# Patient Record
Sex: Female | Born: 1945 | Race: White | Hispanic: No | Marital: Married | Smoking: Current some day smoker
Health system: Southern US, Community
[De-identification: ages and names within clinical notes are randomized; demographics above are authoritative.]

## PROBLEM LIST (undated history)

## (undated) DIAGNOSIS — M1990 Unspecified osteoarthritis, unspecified site: Secondary | ICD-10-CM

## (undated) DIAGNOSIS — I219 Acute myocardial infarction, unspecified: Secondary | ICD-10-CM

## (undated) DIAGNOSIS — D6851 Activated protein C resistance: Secondary | ICD-10-CM

## (undated) DIAGNOSIS — I10 Essential (primary) hypertension: Secondary | ICD-10-CM

## (undated) DIAGNOSIS — J449 Chronic obstructive pulmonary disease, unspecified: Secondary | ICD-10-CM

## (undated) HISTORY — DX: Essential (primary) hypertension: I10

## (undated) HISTORY — PX: BREAST SURGERY: SHX581

## (undated) HISTORY — DX: Acute myocardial infarction, unspecified: I21.9

## (undated) HISTORY — DX: Chronic obstructive pulmonary disease, unspecified: J44.9

## (undated) HISTORY — PX: CATARACT EXTRACTION: SUR2

## (undated) HISTORY — DX: Activated protein C resistance: D68.51

## (undated) HISTORY — DX: Unspecified osteoarthritis, unspecified site: M19.90

## (undated) HISTORY — PX: ABDOMINAL HYSTERECTOMY: SHX81

## (undated) HISTORY — PX: CHOLECYSTECTOMY: SHX55

---

## 2002-09-21 ENCOUNTER — Encounter: Admission: RE | Admit: 2002-09-21 | Discharge: 2002-12-20 | Payer: Self-pay | Admitting: Anesthesiology

## 2005-11-02 ENCOUNTER — Ambulatory Visit: Payer: Self-pay | Admitting: Internal Medicine

## 2005-12-14 ENCOUNTER — Ambulatory Visit: Payer: Self-pay | Admitting: Internal Medicine

## 2007-02-06 ENCOUNTER — Ambulatory Visit: Payer: Self-pay | Admitting: Internal Medicine

## 2007-12-30 ENCOUNTER — Encounter: Payer: Self-pay | Admitting: Internal Medicine

## 2008-01-26 DIAGNOSIS — I219 Acute myocardial infarction, unspecified: Secondary | ICD-10-CM | POA: Insufficient documentation

## 2008-01-26 DIAGNOSIS — J449 Chronic obstructive pulmonary disease, unspecified: Secondary | ICD-10-CM

## 2008-01-26 DIAGNOSIS — D6859 Other primary thrombophilia: Secondary | ICD-10-CM

## 2008-01-26 DIAGNOSIS — J4489 Other specified chronic obstructive pulmonary disease: Secondary | ICD-10-CM | POA: Insufficient documentation

## 2008-01-26 DIAGNOSIS — H409 Unspecified glaucoma: Secondary | ICD-10-CM | POA: Insufficient documentation

## 2008-01-27 ENCOUNTER — Ambulatory Visit: Payer: Self-pay | Admitting: Internal Medicine

## 2008-01-27 DIAGNOSIS — K219 Gastro-esophageal reflux disease without esophagitis: Secondary | ICD-10-CM | POA: Insufficient documentation

## 2008-01-27 DIAGNOSIS — J018 Other acute sinusitis: Secondary | ICD-10-CM | POA: Insufficient documentation

## 2008-01-27 DIAGNOSIS — M79609 Pain in unspecified limb: Secondary | ICD-10-CM | POA: Insufficient documentation

## 2008-01-30 ENCOUNTER — Telehealth (INDEPENDENT_AMBULATORY_CARE_PROVIDER_SITE_OTHER): Payer: Self-pay | Admitting: *Deleted

## 2008-02-16 ENCOUNTER — Telehealth (INDEPENDENT_AMBULATORY_CARE_PROVIDER_SITE_OTHER): Payer: Self-pay | Admitting: *Deleted

## 2008-03-08 ENCOUNTER — Ambulatory Visit: Payer: Self-pay | Admitting: Internal Medicine

## 2008-03-30 ENCOUNTER — Telehealth (INDEPENDENT_AMBULATORY_CARE_PROVIDER_SITE_OTHER): Payer: Self-pay | Admitting: *Deleted

## 2008-07-16 ENCOUNTER — Ambulatory Visit: Payer: Self-pay | Admitting: Oncology

## 2008-08-10 ENCOUNTER — Encounter: Payer: Self-pay | Admitting: Internal Medicine

## 2008-08-10 LAB — CBC WITH DIFFERENTIAL/PLATELET
BASO%: 0.4 % (ref 0.0–2.0)
HCT: 42.2 % (ref 34.8–46.6)
LYMPH%: 32.2 % (ref 14.0–48.0)
MCH: 32.3 pg (ref 26.0–34.0)
MCHC: 34 g/dL (ref 32.0–36.0)
MCV: 95 fL (ref 81.0–101.0)
MONO#: 0.3 10*3/uL (ref 0.1–0.9)
MONO%: 3.8 % (ref 0.0–13.0)
NEUT%: 61.2 % (ref 39.6–76.8)
Platelets: 209 10*3/uL (ref 145–400)
RBC: 4.44 10*6/uL (ref 3.70–5.32)
WBC: 7.5 10*3/uL (ref 3.9–10.0)

## 2008-08-10 LAB — MORPHOLOGY
PLT EST: ADEQUATE
RBC Comments: NORMAL

## 2008-08-15 LAB — COMPREHENSIVE METABOLIC PANEL
ALT: 14 U/L (ref 0–35)
AST: 16 U/L (ref 0–37)
Albumin: 4.2 g/dL (ref 3.5–5.2)
BUN: 15 mg/dL (ref 6–23)
CO2: 26 mEq/L (ref 19–32)
Calcium: 8.8 mg/dL (ref 8.4–10.5)
Chloride: 104 mEq/L (ref 96–112)
Potassium: 4.7 mEq/L (ref 3.5–5.3)

## 2008-08-15 LAB — IMMUNOFIXATION ELECTROPHORESIS
IgA: 151 mg/dL (ref 68–378)
IgG (Immunoglobin G), Serum: 1280 mg/dL (ref 694–1618)
IgM, Serum: 96 mg/dL (ref 60–263)

## 2008-08-15 LAB — LACTATE DEHYDROGENASE: LDH: 194 U/L (ref 94–250)

## 2008-08-15 LAB — CRYOGLOBULIN

## 2008-08-18 LAB — UIFE/LIGHT CHAINS/TP QN, 24-HR UR
Albumin, U: DETECTED
Alpha 2, Urine: DETECTED — AB
Beta, Urine: DETECTED — AB
Free Lambda Lt Chains,Ur: 0.48 mg/dL (ref 0.08–1.01)
Free Lt Chn Excr Rate: 97.88 mg/d
Gamma Globulin, Urine: DETECTED — AB
Volume, Urine: 1250 mL

## 2008-08-18 LAB — CREATININE CLEARANCE, URINE, 24 HOUR
Collection Interval-CRCL: 24 hours
Creatinine, 24H Ur: 999 mg/d (ref 700–1800)
Creatinine, Urine: 79.9 mg/dL
Creatinine: 1.18 mg/dL (ref 0.40–1.20)

## 2008-09-01 ENCOUNTER — Ambulatory Visit: Payer: Self-pay | Admitting: Oncology

## 2009-01-19 ENCOUNTER — Encounter: Admission: RE | Admit: 2009-01-19 | Discharge: 2009-04-19 | Payer: Self-pay | Admitting: Orthopedic Surgery

## 2009-02-09 ENCOUNTER — Ambulatory Visit: Payer: Self-pay | Admitting: Oncology

## 2009-02-11 LAB — CBC WITH DIFFERENTIAL/PLATELET
BASO%: 0.4 % (ref 0.0–2.0)
Basophils Absolute: 0 10*3/uL (ref 0.0–0.1)
HCT: 42.4 % (ref 34.8–46.6)
HGB: 14.4 g/dL (ref 11.6–15.9)
LYMPH%: 36 % (ref 14.0–49.7)
MCHC: 33.9 g/dL (ref 31.5–36.0)
MONO#: 0.4 10*3/uL (ref 0.1–0.9)
NEUT%: 56.2 % (ref 38.4–76.8)
Platelets: 271 10*3/uL (ref 145–400)
WBC: 7.8 10*3/uL (ref 3.9–10.3)
lymph#: 2.8 10*3/uL (ref 0.9–3.3)

## 2009-02-17 LAB — CK: Total CK: 89 U/L (ref 7–177)

## 2009-02-17 LAB — LUPUS ANTICOAGULANT PANEL

## 2009-02-17 LAB — CARDIOLIPIN ANTIBODIES, IGG, IGM, IGA
Anticardiolipin IgA: <7 [APL'U] (ref <13)
Anticardiolipin IgG: <7 [GPL'U] (ref <11)
Anticardiolipin IgM: <7 [MPL'U] (ref <10)

## 2009-02-17 LAB — HOMOCYSTEINE: Homocysteine: 14.6 umol/L (ref 4.0–15.4)

## 2009-02-17 LAB — BETA-2 GLYCOPROTEIN ANTIBODIES
Beta-2 Glyco I IgG: <4 U/mL (ref <20)
Beta-2-Glycoprotein I IgA: 5 U/mL (ref <10)
Beta-2-Glycoprotein I IgM: <4 U/mL (ref <10)

## 2009-02-17 LAB — ALDOLASE: Aldolase: 7.6 U/L (ref <=8.1)

## 2009-02-17 LAB — B. BURGDORFI ANTIBODIES: B burgdorferi Ab IgG+IgM: 0.15 {ISR}

## 2009-07-05 ENCOUNTER — Ambulatory Visit (HOSPITAL_COMMUNITY): Admission: RE | Admit: 2009-07-05 | Discharge: 2009-07-05 | Payer: Self-pay | Admitting: Neurosurgery

## 2009-08-26 ENCOUNTER — Encounter
Admission: RE | Admit: 2009-08-26 | Discharge: 2009-08-30 | Payer: Self-pay | Admitting: Physical Medicine & Rehabilitation

## 2009-08-30 ENCOUNTER — Ambulatory Visit: Payer: Self-pay | Admitting: Physical Medicine & Rehabilitation

## 2009-09-16 ENCOUNTER — Encounter
Admission: RE | Admit: 2009-09-16 | Discharge: 2009-11-16 | Payer: Self-pay | Admitting: Physical Medicine & Rehabilitation

## 2009-09-19 ENCOUNTER — Ambulatory Visit: Payer: Self-pay | Admitting: Physical Medicine & Rehabilitation

## 2010-03-09 ENCOUNTER — Encounter
Admission: RE | Admit: 2010-03-09 | Discharge: 2010-06-07 | Payer: Self-pay | Admitting: Physical Medicine & Rehabilitation

## 2010-03-09 ENCOUNTER — Ambulatory Visit: Payer: Self-pay | Admitting: Physical Medicine & Rehabilitation

## 2010-04-10 ENCOUNTER — Ambulatory Visit: Payer: Self-pay | Admitting: Physical Medicine & Rehabilitation

## 2010-05-09 ENCOUNTER — Encounter
Admission: RE | Admit: 2010-05-09 | Discharge: 2010-07-27 | Payer: Self-pay | Admitting: Physical Medicine & Rehabilitation

## 2010-05-15 ENCOUNTER — Ambulatory Visit: Payer: Self-pay | Admitting: Physical Medicine & Rehabilitation

## 2010-07-27 ENCOUNTER — Ambulatory Visit: Payer: Self-pay | Admitting: Physical Medicine & Rehabilitation

## 2010-10-10 ENCOUNTER — Encounter
Admission: RE | Admit: 2010-10-10 | Discharge: 2010-12-19 | Payer: Self-pay | Source: Home / Self Care | Attending: Physical Medicine & Rehabilitation | Admitting: Physical Medicine & Rehabilitation

## 2010-10-19 ENCOUNTER — Ambulatory Visit: Payer: Self-pay | Admitting: Physical Medicine & Rehabilitation

## 2011-01-11 ENCOUNTER — Encounter: Payer: Medicare Other | Attending: Physical Medicine & Rehabilitation

## 2011-01-11 ENCOUNTER — Encounter: Payer: Medicare Other | Admitting: Physical Medicine & Rehabilitation

## 2011-01-11 DIAGNOSIS — M545 Low back pain, unspecified: Secondary | ICD-10-CM | POA: Insufficient documentation

## 2011-01-11 DIAGNOSIS — IMO0002 Reserved for concepts with insufficient information to code with codable children: Secondary | ICD-10-CM

## 2011-02-16 ENCOUNTER — Encounter: Payer: Medicare Other | Admitting: Physical Medicine & Rehabilitation

## 2011-02-16 ENCOUNTER — Encounter: Payer: Medicare Other | Attending: Physical Medicine & Rehabilitation

## 2011-02-16 DIAGNOSIS — M545 Low back pain, unspecified: Secondary | ICD-10-CM | POA: Insufficient documentation

## 2011-02-16 DIAGNOSIS — IMO0002 Reserved for concepts with insufficient information to code with codable children: Secondary | ICD-10-CM | POA: Insufficient documentation

## 2011-02-16 DIAGNOSIS — M76899 Other specified enthesopathies of unspecified lower limb, excluding foot: Secondary | ICD-10-CM

## 2011-03-20 ENCOUNTER — Encounter: Payer: Medicare Other | Admitting: Physical Medicine & Rehabilitation

## 2011-04-06 NOTE — Consult Note (Signed)
[name], [name]                         ACCOUNT NO.:  [id number]   MEDICAL RECORD NO.:  [id number]                   PATIENT TYPE:  REC   LOCATION:  [facility]                                  FACILITY:  [facility]   PHYSICIAN:  Zachary George, DO                      DATE OF BIRTH:  May 28, 1946   DATE OF CONSULTATION:  11/25/2002  DATE OF DISCHARGE:                                   CONSULTATION   REASON FOR CONSULTATION:  The patient returns to clinic today for  reevaluation.  She is a patient of Dr. Rosana Fret, who was last seen on  October 20, 2002, at which time she underwent radiofrequency neuroablation  of the right sacroiliac joint and right L5-S1 facet joint without any  relief.  She states that two weeks following the procedure, she noticed a  squeezing feeling in her right neck and ear and went to the emergency  department.  She states that she took a nitroglycerin, which helped her  pain, and was told by the physician that she threw a clot.  She is on  chronic Coumadin for factor V deficiency.  She states she did not have any  imaging studies to suggest clot formation but states that this happened back  in 1996 following a cervical epidural steroid injection as well, although at  that time she was not taking Coumadin.  I am uncertain of whether or not the  patient actually had some type of embolus two weeks post injection, as she  had resumed her Coumadin prior to that.  Nevertheless, the patient does not  want any more injections.  Her pain today is a 4/10 on a subjective scale.  She continues on Duragesic per [facility].  I reviewed  health and history form and 14-point review of systems.  No new neurologic  complaints.   PHYSICAL EXAMINATION:  GENERAL:  Physical exam reveals a healthy female in  no acute distress.  VITAL SIGNS:  Blood pressure 159/77, pulse 82, respirations 20, O2  saturation is 94% on room air.  NEUROMUSCULAR:  No focal neurologic  deficits noted involving cranial nerves  and peripheral nerves.  There is tenderness to palpation, right buttock and  sacroiliac joint region, without change.   IMPRESSION:  1. Chronic low back pain.  2. Sacroiliac joint pain, chronic.  3. Factor V deficiency.   PLAN:  1. The patient is to follow up with [facility].  Further     interventional procedures are not warranted at this time.  2. Recommend a trial of aquatic therapy, yoga or pilates.  3. Consider Lidoderm 5% patches.  4. Further directed care through [facility], Dr. Roseanne Reno.  Zachary George, DO    JW/MEDQ  D:  11/25/2002  T:  11/26/2002  Job:  [id number]   cc:   [address], [place], [place]  [address] [place] Newcomer.D.

## 2011-04-06 NOTE — Assessment & Plan Note (Signed)
[facility]                             PULMONARY OFFICE NOTE   [name], [name]                      MRN:          [id number]  DATE:02/06/2007                            DOB:          [birth year]    PROBLEM:  1. Chronic obstructive pulmonary disease.  2. History of myocardial infarction x2 without coronary disease.  3. Hypercoagulable state factor V Leiden (on chronic Coumadin).  4. Glaucoma.   HISTORY:  One-year followup.  She was able to stop smoking using  Chantix, but she still feels a little tentative about the issue.  Much  encouragement was given to remain off.  Is dealing with fibromyalgia.  Bitemporal headache for the past 2 weeks with nasal congestion.  No  fever, purulent discharge, or sore throat.   MEDICATIONS:  1. Kadian 40 mg b.i.d.  2. Xanax 0.5 mg b.i.d.  3. Benicar 30 mg.  4. Premarin 0.625 mg.  5. Nexium 40 mg.  6. Coumadin.  7. Cardizem 180 mg.  8. Relafen 500 mg.  9. Aspirin 81 mg.  10.Calcium.  11.Combivent rescue inhaler used p.r.n.  12.Lyrica.   DRUG INTOLERANT TO:  1. PENICILLIN.  2. SULFA.   OBJECTIVE:  Weight 169 pounds.  BP 138/88.  Pulse regular at 74.  Room  air saturation 97%.  I do not see obvious nasal congestion, postnasal drainage, pharyngeal  irritation, or cervical adenopathy.  She is not especially hoarse.  I  cannot tell if temporal arteries are tender.  LUNG SOUNDS:  Diminished without cough or wheeze.  HEART:  Sounds regular without murmur.   IMPRESSION:  This may be a seasonal rhinitis with headache aggravated by  a tension headache component.  Temporal arteritis can be watched for.  She will be following up with her primary physician.  I am delighted  that she has stopped smoking, but she does have chronic obstructive  pulmonary disease demonstrated by pulmonary function test on December 14, 2005 showing moderate obstructive airways disease with insignificant  response to  bronchodilator.  FEV1 after dilator was 1.41 or 62% of  predicted.  Lung volumes were normal.  Diffusion was moderately reduced  at 60% of predicted suggesting this will be mostly emphysema.  Her chest  x-ray in December 2006 had shown apical scarring with comparison  recommended.   PLAN:  1. Chest x-ray.  2. Nasal inhalation with Neo-Synephrine.  3. Depo-Medrol 80 mg IM with steroid talk.  4. Try Sudafed-PE.  5. See Dr. Virginia Rochester for persistent bitemporal headache if needed.  6. Schedule return here in 1 year, but earlier p.r.n.     Clinton D. Maple Hudson, MD, Tonny Bollman, FACP  Electronically Signed    CDY/MedQ  DD: 02/08/2007  DT: 02/08/2007  Job #: [id number]   cc:   Royanne Foots

## 2011-04-06 NOTE — Op Note (Signed)
[name], [name]                         ACCOUNT NO.:  [id number]   MEDICAL RECORD NO.:  [id number]                   PATIENT TYPE:  REC   LOCATION:  [facility]                                  FACILITY:  [facility]   PHYSICIAN:  Celene Kras, MD                     DATE OF BIRTH:  31-Dec-1945   DATE OF PROCEDURE:  10/20/2002  DATE OF DISCHARGE:                                 OPERATIVE REPORT   BRIEF HISTORY:  The patient comes in for pain management today.  I have  evaluated her via health and history form 14-point review of systems.   1. The patient comes in remarking that she had excellent diminution of pain     perception and a very positive response to sacral joint injection with 5-     0 facetal companion injection as contributory innervation.  She has put     up a Christmas tree, dropped her pain level, and is only now starting to     recrudesce.  She is noticing some decline in functional indices and     quality of life indices after the injection, and only recently noting her     pain closely approximating a return to pre block position.  I reviewed     the risks, complications and options of moving forward with RF.  The     rationale is that RF will minimize further exposure to steroids, and the     necessity for her to come off of Coumadin for factor V deficiency.  She     also understands she is not to take NSAIDs, although they did help.     Another rationale is to minimize escalation of narcotic based pain     medication as one of our only viable pain treatment strategies.  I do     think she had a facet component as well as sacral joint component, we     have demonstrated a positive provocative block with improved functional     indices and quality of life indices and decreased medication usage.  2. I will go ahead and proceed with RF today, she understands that the risks     of this procedure include bleeding, infection, nerve damage, potential     escalation of pain, no  relief, nerve damage, stroke and death.  I do     believe that the risk-reward benefit is in her favor, and she understands     that we are looking at a 25 to 50% diminution in pain perception but more     importantly, improved functional indices.   She is also instructed to go back on her Coumadin.  We checked her PT today.  It is 11.8.  INR is normal.  Instructed to maintain contact with primary  care.  We will predicate any further injection on need and overall response  and consider  recommending further extending thermal lesion to the facet and  contralateral sacral joint if necessary.  She has had pulsatile radio  frequency in the past and I think a thermal lesion will probable give [place] a  little more reach.  She has consented.   PHYSICAL EXAMINATION:  Physical exam reveals a pleasant female sitting  comfortably in bed.  Gait, affect, and appearance is normal.  She has pain  over PSIS and Gaeslens and Patrick's are positive right greater than left.  Pain with extension and no new neurological findings. Motor and sensory are  reflexive.   IMPRESSION:  1. Sacral joint pain, facet syndrome.  2. Degenerative spine disease lumbar spine.  3. Osteoarthritis and otherwise as noted.  4. Factor V deficient.   PLAN:  Sacral joint injection, right side with radiofrequency thermal  ablation in a stairstep fashion, and contributory innervation will be  addressed by radiofrequency neuroablation to the 5-1 facet.  She has  consented.   DESCRIPTION OF PROCEDURE:  The patient was taken to the fluoroscopy suite  and placed in the prone position.  Back prepped and draped in the usual  fashion.  Using a 22 gauge 10 mm active tip RF needle, we advanced in a  stair step fashion to three active lesion points at the right sacral joint.  I confirmed placement with multiple fluoroscopic positions.  I used Isovue  200, appropriately stimulated both motor and sensory.  I then followed 3 cc  of lidocaine  and 20 mg of Aristocort and reconfirmed placement at multiple  points during this procedure.  We then lesioned at 70 degree for 70 seconds at three independent points,  and then moved to the 5-1 facet.  Again reconfirmed needle placement, and  used Isovue 200, and clear identification of the appropriate landmarks  followed.  Stimulation to motor and sensory was uneventful and we injected 1  cc of lidocaine 1% MPF and 10 mg of Aristocort. Lesion was performed at 70  degrees for seconds. In the recovery area she had near complete diminution  in pain perception on assessment within the context of activities of daily  living.  No complications from our procedure.  Discharge instructions given.  We will see her in follow-up at about one to one and a half months.  Instructed to maintain contact with [facility].                                               Celene Kras, MD    HH/MEDQ  D:  10/20/2002  T:  10/20/2002  Job:  [id number]

## 2011-04-06 NOTE — Op Note (Signed)
[name], [name]                         ACCOUNT NO.:  [id number]   MEDICAL RECORD NO.:  [id number]                   PATIENT TYPE:  REC   LOCATION:  [facility]                                  FACILITY:  [facility]   PHYSICIAN:  Hans C. Stevphen Rochester, M.D.                DATE OF BIRTH:  [birth year]   DATE OF PROCEDURE:  DATE OF DISCHARGE:                                 OPERATIVE REPORT   HISTORY OF PRESENT ILLNESS:  [name] comes to center for pain  management today as a kind referral from [facility]. She is a  pleasant 65 year old who comes to [place] complaining of paracervical and  paralumbar pain. The paralumbar pain is most problematic, right greater than  left, and she does have a left-sided component. She has infragluteal pain,  and she relates this back a number of years from a fall in  1985. She has  had facetal component addressed which helped from a myofacial perspective,  but she still has decline in endurance, range of motion and functional  indices, relating her pain as a 7/10 on a subjective scale with cervical  myofacial position is less problematic than the infragluteal component, most  notably to the right. She has declined sleep and endurance. Suprascapular,  paracervical, paralumbar, myofacial pain is described. She has no bowel or  bladder dysfunction. She describes it as dull, constant aching and sharp,  improved with ice and medication, made worse by walking, bending, working  and therapy. [facility] from the [facility] as well as imaging  reports are reviewed. She has no recent injury.   MEDICATIONS:  1. Premarin.  2. Zanaflex.  3. Desipramine.  4. Coumadin.  5. Lescol.  6. Prilosec.  7. Xanax.  8. Duragesic.   ALLERGIES:  SULFA AND PENICILLIN.   States no wish to harm herself or others.   A 14 point review of systems and health and history form were reviewed. Most  notably two MIs in 1998 and 1996 led to the diagnosis of factor V  deficiency  and hypercoagulable.  She has come off Coumadin in the past uneventfully and  instructed to take aspirin. We will clear her to come for procedure.   PAST SURGICAL HISTORY:  Otherwise denied.   Her cardiac events were uneventful from a recovery standpoint.   FAMILY HISTORY:  Remarkable for hypertension and heart disease.   SOCIAL HISTORY:  She is a previous smoker, quit three months ago.  She does  not drink alcohol. She is married. She is currently not working.   Review of systems, family and social history otherwise noncontributory to  the main problem.   PHYSICAL EXAMINATION:  GENERAL: This is a pleasant female, sitting  comfortably in bed. Gait, affect appears in normal range x 3.  HEENT:  Unremarkable.  CHEST:  Clear to auscultation and percussion.  CARDIAC:  Regular rate and rhythm without murmurs, rubs, gallops.  ABDOMEN:  Soft, nontender, benign, no hepatosplenomegaly.  BACK:  She has  diffuse paracervical and suprascapular and myofascial discomfort, paralumbar  myofascial discomfort, most  notably over  the PSIS and pain on extension.  Gaenslen's and Patrick's equivocal.  She does seem to have a positive  Patrick's on the right, but she has difficulty performing this maneuver  secondary to pain. I do believe that the sacral joint component is involved.  She has no other overt neurological deficit, motor, sensory or reflex.   IMPRESSION:  1. Factor V deficiency.  2. Degenerative spinal disease, lumbar spine.  3. Degenerative spinal disease, cervical spine.  4. Myofacial pain syndrome.  5. Poor overall health characteristics.   PLAN:  1. Lifestyle enhancements discussed as well as home based therapy.  2. Consider TENS technology or muscle stimulator.  3. Clear to come off Coumadin and continue aspirin therapy and consider SI     joint injection versus radiofrequency neuroablation to best determine     pain generator.  4. Consider treatment in the myofascial  amplification, most notably in the     cervical spine, as I believe there is a facetal component.  5. Maintain contact with [facility].  6. We will see her in followup in directed care. Her care will be predicated     on ability to discontinue anticoagulation and reviewed with the patient.     The risks of this is discussed. She accepts.   We will see her in followup, extensive consultation.                                               Hans C. Stevphen Rochester, M.D.    [facility]  D:  09/22/2002  T:  09/22/2002  Job:  [id number]   cc:   [facility]

## 2011-04-06 NOTE — Op Note (Signed)
NAMEALIZZON, DIOGUARDI                         ACCOUNT NO.:  [id number]   MEDICAL RECORD NO.:  [id number]                   PATIENT TYPE:  REC   LOCATION:  [facility]                                  FACILITY:  [facility]   PHYSICIAN:  Hans C. Stevphen Rochester, M.D.                DATE OF BIRTH:  05-26-1946   DATE OF PROCEDURE:  10/06/2002  DATE OF DISCHARGE:                                 OPERATIVE REPORT   INDICATIONS FOR PROCEDURE:  [name] comes to [facility] today to evaluate and review health and history form and 14-point  review of systems.   Problem 1.  Her PT, PTT, INR are within normal limits.  She was instructed  to go back on her Coumadin.   Problem 2.  I do think it is important that we proceed with sacral joint  injection and prior to ORIF clearly identify pain generator.  She had kind  of a mixed picture in the past with good response from lumbar epidural and  questionable response from sacral joint injection and I will clarify.   Problem 3.  I will add the 5-1 facet medial branch, as this is contributory.  She does describe pain consistent with this.  Indications are declining  functional indices, quality of life indices, and restorative sleep cavity.  Examination does support sacral joint injection, but as she also has  significant extension pain, I am wondering if this will not need to be  investigated at some time as well.   Objectively, diffuse paralumbar myofascial discomfort, not significantly  changed.  _________ is intact, was positive right and left.  Pain over PSIS  with notable pain on extension, no new neurological findings, motor,  sensory, or reflexive.   IMPRESSION:  Degenerative spine disease of the lumbar spine, sacral joint  pain, facet syndrome.   PLAN:  Facet medial branch block L5-S1 as contributory intervention. We will  also inject sacral joint right and left side.  This will be at inferior  margin.  She has consented.  We will call  her in follow-up and will not have  her come off her Coumadin until we can reassess our direction.  A strong  positive provocative block with consideration of radiofrequency neural  ablation.  She has consented.   DESCRIPTION OF PROCEDURE:  The patient was taken to the operating room and  placed in the prone position.  The back was prepped and draped in the usual  fashion.  Using a 22 gauge needle, I advanced the medial branch 5-1 facet  right and left sided, independent needle access points, confirmed placement  in multiple fluoroscopic positions, and injected 1 cc of lidocaine 1% MPF  and 5 mg of Aristocort at each site.   Care was then addressed to the inferior medial margin of the sacral joint  right and left side and confirmed placed in multiple fluoroscopic positions.  I then injected 3 cc of lidocaine 1% MPF to the left and right side with 15  mg of Aristocort to each side as well.   Tolerated the procedure well with no complications from our procedure.  Appropriate recovery.  Discharge instructions given.  Will see her in follow-  up.  Further injection based on need and overall response.                                               Hans C. Stevphen Rochester, M.D.    [facility]  D:  10/06/2002  T:  10/06/2002  Job:  [id number]   cc:   [facility]

## 2011-04-10 ENCOUNTER — Encounter: Payer: Medicare Other | Attending: Physical Medicine & Rehabilitation

## 2011-04-10 ENCOUNTER — Encounter: Payer: Medicare Other | Admitting: Physical Medicine & Rehabilitation

## 2011-04-10 DIAGNOSIS — M545 Low back pain, unspecified: Secondary | ICD-10-CM | POA: Insufficient documentation

## 2011-04-10 DIAGNOSIS — R209 Unspecified disturbances of skin sensation: Secondary | ICD-10-CM

## 2011-04-10 DIAGNOSIS — IMO0002 Reserved for concepts with insufficient information to code with codable children: Secondary | ICD-10-CM | POA: Insufficient documentation

## 2011-04-10 DIAGNOSIS — R279 Unspecified lack of coordination: Secondary | ICD-10-CM

## 2011-04-11 NOTE — Assessment & Plan Note (Signed)
REASON FOR VISIT:  Poor balance and falls.  A 65 year old female who has been followed by PM and R here for about a year and half, primarily for lumbar epidural steroid injections.  She does have a history of weakness and paresthesias and has been worked up by Neuromuscular Disease Department at [facility] with EMG.  She has had MRIs as well as lumbar CT myelograms which last performed 2010 showing no significant compressive lesions, some spondylolisthesis.  She has had facial paresthesias feeling like something is on her skin but it is not there.  She has had upper extremity and lower extremity paresthesias. She does have some neck pain.  She has had some swallowing problems at times as well, although she is also being seen by Gastroenterology.  She has been evaluated by Neurosurgery in the past.  In terms of her low back neck pain, she has had some relief with medial branch blocks and sacroiliac injections done in 2003 per Dr. Andrey Campanile and Dr. Stevphen Rochester when they were at this clinic.  Her last epidural steroid injection was performed October 19, 2010.  She does get approximately 3 months relief.  Her right trochanteric bursa injection was not particularly helpful performed February 16, 2011.  PHYSICAL EXAMINATION:  She has a bruise on the left hip, small hematoma. She has some bruises on her forearms.  Face shows no evidence of facial droop.  Extraocular muscles are intact. Tongue is midline.  Speech is without dysarthria or aphasia.  Her upper extremity strength is 5/5 deltoid, biceps, triceps, grip.  Lower extremity strength is 5/5 hip flexion, knee extension, ankle dorsiflexor.  Deep tendon reflexes are 3+ bilateral upper and bilateral knees, 2+ bilateral ankles.  Sensory exam was diminished bilateral ulnar nerve distribution.  Extremities does have a lipoma in the medial forearm on the left side.  Minimal intrinsic atrophy on left side compared to the right side. Motor strength is 4/5 in  the left grip, biceps, triceps; otherwise 5/5 throughout upper and lower extremities bilaterally.  Romberg, she tends to fall forward with her eyes closed.  Babinski's are downgoing bilaterally.  Hoffman's is negative.  IMPRESSION: 1. Falls and paresthesias.  Previous workup included lower extremity     EMG as well as lumbar CT myelogram, which were non-revealing.     Given that she has both facial and upper extremity symptomatology     as well as some mildly hyporeactive reflexes, we will check MRI of     the brain to see if there is any cerebral lesions.  This would have     to be a chronic process given a 4-year history. 2. Upper extremity sensory deficit in ulnar distribution.  We will     check EMG/NCV.  She also has some numbness in the hand, apparent     when she is on the telephone suggesting more of a median     neuropathy, once again upper extremity EMG would be helpful. 3. Chronic low back pain.  She does have some facet arthropathy noted,     has had some previous good relief with     medial branch block.  She may be a candidate for RF, but we will do     this once we get the workup for her other symptomatology resolved.     Discussed with the patient, agrees with plan.     Erick Colace, M.D. Electronically Signed    AEK/MedQ D:  04/10/2011 15:41:29  T:  04/11/2011 04:52:05  Job #:  [id number]  cc:   Royanne Foots Fax: [phone]

## 2011-04-18 ENCOUNTER — Other Ambulatory Visit: Payer: Self-pay | Admitting: Physical Medicine & Rehabilitation

## 2011-04-18 DIAGNOSIS — R52 Pain, unspecified: Secondary | ICD-10-CM

## 2011-04-23 ENCOUNTER — Inpatient Hospital Stay (HOSPITAL_COMMUNITY): Admission: RE | Admit: 2011-04-23 | Payer: Medicare Other | Source: Ambulatory Visit

## 2011-04-30 ENCOUNTER — Ambulatory Visit (HOSPITAL_COMMUNITY)
Admission: RE | Admit: 2011-04-30 | Discharge: 2011-04-30 | Disposition: A | Discharge status: Home / Self Care | Payer: Medicare Other | Source: Ambulatory Visit | Attending: Physical Medicine & Rehabilitation | Admitting: Physical Medicine & Rehabilitation

## 2011-04-30 DIAGNOSIS — R279 Unspecified lack of coordination: Secondary | ICD-10-CM | POA: Insufficient documentation

## 2011-04-30 DIAGNOSIS — R269 Unspecified abnormalities of gait and mobility: Secondary | ICD-10-CM | POA: Insufficient documentation

## 2011-04-30 DIAGNOSIS — R209 Unspecified disturbances of skin sensation: Secondary | ICD-10-CM | POA: Insufficient documentation

## 2011-04-30 DIAGNOSIS — R29898 Other symptoms and signs involving the musculoskeletal system: Secondary | ICD-10-CM | POA: Insufficient documentation

## 2011-04-30 DIAGNOSIS — R52 Pain, unspecified: Secondary | ICD-10-CM

## 2011-05-04 ENCOUNTER — Ambulatory Visit: Payer: Medicare Other | Admitting: Physical Medicine & Rehabilitation

## 2011-05-25 ENCOUNTER — Ambulatory Visit: Payer: Medicare Other | Admitting: Physical Medicine & Rehabilitation

## 2011-05-25 ENCOUNTER — Encounter: Payer: Medicare Other | Attending: Physical Medicine & Rehabilitation

## 2011-05-25 DIAGNOSIS — M545 Low back pain, unspecified: Secondary | ICD-10-CM | POA: Insufficient documentation

## 2011-05-25 DIAGNOSIS — IMO0002 Reserved for concepts with insufficient information to code with codable children: Secondary | ICD-10-CM | POA: Insufficient documentation

## 2011-05-25 NOTE — Assessment & Plan Note (Signed)
CHIEF COMPLAINT:  Back pain.  HISTORY:  A 65 year old female followed primarily for lumbar stenosis and radicular pain in the lower extremities, has had epidural steroid injections which have been helpful for about 59-month period of time. More recently she has complained of balance problems and falls, but these improved without any treatment.  We did do an MRI of the brain which showed no evidence of any acute changes, no demyelinating changes, no evidence of CVAs.  She has also had weakness, paresthesias in the limbs, had a EMG through the Neuromuscular Disease Department at [facility].  I do not have these results.  In addition, she has left elbow weakness, paresthesias in the left hand. I was suspicious for ulnar neuropathy.  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  She has had GI complaints.  She has a history of colitis.  She has had problems with swelling in the legs, has had Cardiology workup once again with a normal ejection fraction on echocardiogram, normal stress test.  Her pain level is about 5/10, mainly in the low back area, some buttock pain.  Described as constant, moderate effect on activity, walking, bending, sitting, standing, all seem to exacerbate her, pain relief from meds is fair.  She does get her hydrocodone from her primary care physician.  She has bowel and bladder control problems, weakness, tingling, trouble walking, anxiety, constipation on her review of systems.  SOCIAL HISTORY:  Married, lives with her husband, smokes a pack per day still despite her medical problems.  PHYSICAL EXAMINATION:  VITAL SIGNS:  Blood pressure 101/59, pulse 74, respirations 16, and O2 sat 94% on room air. GENERAL:  No acute stress.  Mood and affect appropriate.  Elderly female in no acute distress. BACK:  Tenderness to palpation in lumbosacral junction.  She has negative straight leg raising test.  She has no evidence of wasting,  she has 2+ edema bilateral pretibial to the foot.  No problems with ambulation.  IMPRESSION:  Lumbar stenosis has intermittent radicular symptomatology. In addition, she has left upper extremity pain.  I suspect left upper extremity more of ulnar neuropathy, she may have some mass effect from lipoma.  She did give me MRI of the elbow to evaluate.  Certainly this would not give [place] nerve functioning information.  I discussed this with the patient.  Tried to looking at the MRI on my desk computer and unfortunately this could not be read.  We discussed her back pain at greater length given that she likely has some spondylosis as at least potential etiology of her pain, medial branch blocks will be helpful.  I also reviewed the report from [facility], there is no compressive lesion over her ulnar nerve or around the elbow area. Collateral ligaments were intact.  Biceps tendon was intact.  Normal appearance of the cubital tunnel.  Should symptoms worsen or not improve may benefit from ulnar nerve EMG, plus/minus ultrasound.     Erick Colace, M.D. Electronically Signed    AEK/MedQ D:  05/25/2011 13:47:27  T:  05/25/2011 17:17:08  Job #:  [id number]  cc:   Dr. [facility]

## 2011-07-05 ENCOUNTER — Ambulatory Visit (HOSPITAL_BASED_OUTPATIENT_CLINIC_OR_DEPARTMENT_OTHER)
Admission: RE | Admit: 2011-07-05 | Discharge: 2011-07-05 | Disposition: A | Discharge status: Home / Self Care | Payer: Medicare Other | Source: Ambulatory Visit | Attending: Physical Medicine & Rehabilitation | Admitting: Physical Medicine & Rehabilitation

## 2011-07-05 ENCOUNTER — Encounter: Payer: Medicare Other | Admitting: Physical Medicine & Rehabilitation

## 2011-07-05 ENCOUNTER — Encounter: Payer: Medicare Other | Attending: Physical Medicine & Rehabilitation

## 2011-07-05 DIAGNOSIS — M47817 Spondylosis without myelopathy or radiculopathy, lumbosacral region: Secondary | ICD-10-CM

## 2011-07-05 DIAGNOSIS — M545 Low back pain, unspecified: Secondary | ICD-10-CM | POA: Insufficient documentation

## 2011-07-05 DIAGNOSIS — IMO0002 Reserved for concepts with insufficient information to code with codable children: Secondary | ICD-10-CM | POA: Insufficient documentation

## 2011-07-05 NOTE — Procedures (Signed)
NAMEMITRA, DULING             ACCOUNT NO.:  [id number]  MEDICAL RECORD NO.:  [id number]           PATIENT TYPE:  O  LOCATION:  [facility]                          FACILITY:  [facility]  PHYSICIAN:  Erick Colace, M.D.DATE OF BIRTH:  30-Jun-1946  DATE OF PROCEDURE:  07/05/2011 DATE OF DISCHARGE:  05/25/2011                              OPERATIVE REPORT  PROCEDURE:  Bilateral L5 dorsal ramus injection, bilateral L4 medial branch block, bilateral L3 medial branch block under fluoroscopic guidance.  INDICATIONS:  Lumbar spondylosis with axial back pain.  No radicular component.  Informed consent was obtained after describing risks and benefits of the procedure with the patient.  These include bleeding, bruising and infection.  She elects to proceed and has given written consent.  Time- out taken to confirm proper levels.  Informed consent was obtained after describing risks and benefits of the procedure with the patient.  She elects to proceed and has given written consent.  She has pain that persists despite narcotic analgesic medications and other conservative care, interferes with activities and is rated as severe.  The patient placed prone on fluoroscopy table.  Betadine prep, sterile drape, 25-gauge inch and half needle was used to anesthetize the skin and subcu tissue 1% lidocaine x2 mL.  Then, a 25-gauge inch and half needle was used to anesthetize the skin and subcu tissue 1% lidocaine x 1 mL at each of 6 sites and a 22-gauge, 3-1/2-inch spinal needle was inserted under fluoroscopic guidance, first starting at the left S1, SAP sacroiliac junction, bone contact made.  Omnipaque 180 x 0.5 mL demonstrated no intravascular uptake, then 0.5 mL of a solution containing 1 mL of 4 mg/mL dexamethasone and 2 mL of 2% MPF lidocaine was injected.  Then, the left L5, SAP transverse process junction targeted, bone contact made.  Omnipaque 180 x 0.5 mL demonstrated no intravascular uptake.   Then, 0.5 mL of the dexamethasone lidocaine solution was injected.  Then, a left L4, SAP transverse process junction targeted, bone contact made.  Omnipaque 180 x 0.5 mL demonstrated no intravascular uptake, then 0.5 mL of the dexamethasone lidocaine solution was injected.  The same procedure was repeated on the right side using the same needle injectate and technique.  The patient tolerated the procedure well.  Pre and post injection vitals stable. Post injection instructions given.     Erick Colace, M.D. Electronically Signed    AEK/MEDQ  D:  07/05/2011 12:37:35  T:  07/05/2011 13:07:46  Job:  [id number]

## 2011-07-27 ENCOUNTER — Ambulatory Visit: Payer: Medicare Other | Admitting: Physical Medicine & Rehabilitation

## 2011-07-27 ENCOUNTER — Encounter: Payer: Medicare Other | Attending: Physical Medicine & Rehabilitation

## 2011-07-27 DIAGNOSIS — M545 Low back pain, unspecified: Secondary | ICD-10-CM | POA: Insufficient documentation

## 2011-07-27 DIAGNOSIS — M79609 Pain in unspecified limb: Secondary | ICD-10-CM

## 2011-07-27 DIAGNOSIS — M48061 Spinal stenosis, lumbar region without neurogenic claudication: Secondary | ICD-10-CM

## 2011-07-27 DIAGNOSIS — M47817 Spondylosis without myelopathy or radiculopathy, lumbosacral region: Secondary | ICD-10-CM

## 2011-07-27 DIAGNOSIS — IMO0002 Reserved for concepts with insufficient information to code with codable children: Secondary | ICD-10-CM | POA: Insufficient documentation

## 2011-07-27 NOTE — Assessment & Plan Note (Signed)
HISTORY:  The patient returns today and last saw me a month ago for medial branch blocks bilateral L5, L4, L3.  She had 100% relief with pain for 6 days and pain has returned.  She has had lower extremity pain and has had workup.  She is going back to a vein specialist to see whether this would be helpful to get treatment for her varicose veins. She has a family history of lower extremity pain.  Her mother had some type of hypersensitivity.  She had varicose veins as well.  She has had some relief with epidural injections fairly short-term, but certainly could be one of her pain generators in the lower extremity. She reportedly had EMG and NCV for about 3 years ago which was reportedly normal.  She is on Coumadin.  She has gotten the okay to discontinue Coumadin 5-7 days prior to spine procedure.  Her back has tenderness to palpation in the lumbosacral junction, pain with extension greater than with flexion.  Straight leg raising test negative.  Sensory exam, she has some decreased sensation in the left toes compared to left knee, but equal on the right side.  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  She has some ecchymosis around her toes.  IMPRESSION: 1. Deep tendon reflexes are normal in the lower extremities. 2. Lumbar pain.  Lumbar spinal stenosis while I think some of her pain     may be radicular, certainly has other pain etiologies.  I favor re-     examining the potential role of her venous varicosities as a     potential pain generator.  We also discussed repeating EMG and NCV     which she really does not want to pursue at this time. 3. Low back pain.  She has had 100% relief with medial branch blocks.     She wishes to pursue another set to evaluate whether she would be a     candidate for radiofrequency neurotomy.  Discussed with the patient     and agrees with plan.     Erick Colace, M.D. Electronically Signed    AEK/MedQ D:  07/27/2011 13:10:25  T:   07/27/2011 17:11:12  Job #:  [id number]  cc:   Royanne Foots, MD Fax: [phone]

## 2011-08-16 ENCOUNTER — Ambulatory Visit: Payer: Medicare Other | Admitting: Physical Medicine & Rehabilitation

## 2011-08-29 DIAGNOSIS — M109 Gout, unspecified: Secondary | ICD-10-CM | POA: Insufficient documentation

## 2011-09-03 ENCOUNTER — Encounter: Payer: Medicare Other | Attending: Physical Medicine & Rehabilitation

## 2011-09-03 ENCOUNTER — Encounter: Payer: Medicare Other | Admitting: Physical Medicine & Rehabilitation

## 2011-09-03 DIAGNOSIS — M47817 Spondylosis without myelopathy or radiculopathy, lumbosacral region: Secondary | ICD-10-CM

## 2011-09-03 DIAGNOSIS — M545 Low back pain, unspecified: Secondary | ICD-10-CM | POA: Insufficient documentation

## 2011-09-03 DIAGNOSIS — IMO0002 Reserved for concepts with insufficient information to code with codable children: Secondary | ICD-10-CM | POA: Insufficient documentation

## 2011-09-03 NOTE — Procedures (Signed)
NAMEEVERETTE, DIMAURO             ACCOUNT NO.:  [id number]  MEDICAL RECORD NO.:  [id number]           PATIENT TYPE:  O  LOCATION:  [facility]                          FACILITY:  [facility]  PHYSICIAN:  Erick Colace, M.D.DATE OF BIRTH:  18-Dec-1945  DATE OF PROCEDURE: DATE OF DISCHARGE:                              OPERATIVE REPORT  PROCEDURE:  Bilateral L5 dorsal ramus injection, bilateral L4 and L3 medial branch block under fluoroscopic guidance.  INDICATIONS:  Lumbar spondylosis.  She has had greater than 50% relief with medial branch block performed initially July 05, 2011 here for repeat.  Her pain is only partially responsive to medication management and other conservative care interferes with activities.  Informed consent was obtained.  After describing risks and benefits of the procedure with the patient, these include bleeding, bruising and infection, she elects to proceed and has given written consent, taken for proper patient procedure.  Average pain score 8/10.  The patient placed prone on fluoroscopy table.  Betadine prep, sterile drape, 25-gauge inch and half needle was used to anesthetize the skin and subcu tissue 1.5 mL at each of 6 sites.  Then, a 22-gauge, 3-1/2 spinal needle was inserted under fluoroscopic guidance first starting at the left S1 SAP sacroiliac junction, bone contact made.  Omnipaque 180 x 0.5 mL demonstrated no intravascular uptake, then 0.5 mL of 0.5% Marcaine was injected.  Then, the left L5 SAP transverse process junction targeted, bone contact made.  Omnipaque 180 x 0.5 mL demonstrated no intravascular uptake.  Then, 0.5 mL of Marcaine 0.5% injected.  Then the left L4 SAP transverse process junction targeted, bone contact made.  Omnipaque 180 x 0.5 mL demonstrated no intravascular uptake, then 0.5 mL of the Marcaine solution was injected.  The same procedure was repeated on the right side using same needle injectate and technique.  The patient  tolerated the procedure well.  Pre and post injection vitals stable.  Post injection instructions given.     Erick Colace, M.D. Electronically Signed    AEK/MEDQ  D:  09/03/2011 12:48:13  T:  09/03/2011 21:13:25  Job:  [id number]

## 2011-10-04 ENCOUNTER — Encounter: Payer: Medicare Other | Admitting: Physical Medicine & Rehabilitation

## 2011-10-04 ENCOUNTER — Encounter: Payer: Medicare Other | Attending: Physical Medicine & Rehabilitation

## 2011-10-04 DIAGNOSIS — M47817 Spondylosis without myelopathy or radiculopathy, lumbosacral region: Secondary | ICD-10-CM

## 2011-10-04 DIAGNOSIS — M545 Low back pain, unspecified: Secondary | ICD-10-CM | POA: Insufficient documentation

## 2011-10-04 DIAGNOSIS — IMO0002 Reserved for concepts with insufficient information to code with codable children: Secondary | ICD-10-CM | POA: Insufficient documentation

## 2011-10-04 NOTE — Procedures (Signed)
[name], [name]             ACCOUNT NO.:  [id number]  MEDICAL RECORD NO.:  [id number]           PATIENT TYPE:  O  LOCATION:  [facility]                          FACILITY:  [facility]  PHYSICIAN:  Erick Colace, M.D.DATE OF BIRTH:  [birth year]  DATE OF PROCEDURE:  10/04/2011 DATE OF DISCHARGE:                              OPERATIVE REPORT  PROCEDURE:  Right L5 dorsal ramus, right L4 and right L3 medial branch radiofrequency neurotomy under fluoroscopic guidance.  INDICATION:  Lumbar pain with pain only partially response to medication management, other conservative care.  Interfering with activities and self-care.  Pain is rated as 4/10 currently, increase it 5/10 with certain activities.  Pain has been improved by 100% on 2 occasions by medial branch blocks at corresponding levels.  She has been off her Coumadin,  okayed to do so by her cardiologist Dr. Virginia Rochester.  Informed consent was obtained after describing risks and benefits of the procedure with the patient.  These include bleeding, bruising, and infection.  She elects to proceed and has given written consent.  The patient placed prone on fluoroscopy table.  Betadine prep, sterile drape, 25-gauge inch and half needle was used to anesthetize skin and subcu tissue, 1% lidocaine x2 mL at each of 3 sites.  Then, a 20-gauge 10 cm RF needle with 10-mm curved active tip was inserted under fluoroscopic guidance starting the right S1 SAP sacral ala junction, bone contact made, confirmed with lateral imaging.  Sensory stem at 50 Hz followed by motor stem at 2 Hz, confirmed proper needle location, followed by injection 1 mL of solution containing 1 mL of 4 mg/mL dexamethasone and 2 mL of 1% MPF lidocaine.  Then, the right L5 SAP transverse process junction targeted, bone contact made, confirmed with lateral imaging.  Sensory stem at 50 Hz followed by motor stem at 2 Hz, confirmed proper needle location, followed by injection 1 mL of  the dexamethasone-lidocaine solution and radiofrequency lesioning 70 degrees Celsius for 90 seconds.  Then, the right L4 SAP transverse process junction targeted, bone contact made, confirmed with lateral imaging. Sensory stem at 50 Hz followed by motor stem at 2 Hz, confirmed proper needle location followed by injection 1 mL of the dexamethasone- lidocaine solution and radiofrequency lesioning 70 degrees Celsius for 90 seconds.  The patient tolerated procedure well.  Postprocedure instructions given.     Erick Colace, M.D. Electronically Signed    AEK/MEDQ  D:  10/04/2011 13:13:52  T:  10/04/2011 14:21:55  Job:  [id number]

## 2011-11-02 ENCOUNTER — Ambulatory Visit: Payer: Medicare Other | Admitting: Physical Medicine & Rehabilitation

## 2011-11-30 ENCOUNTER — Ambulatory Visit: Payer: Medicare Other | Admitting: Physical Medicine & Rehabilitation

## 2011-11-30 ENCOUNTER — Encounter: Payer: Medicare Other | Attending: Physical Medicine & Rehabilitation

## 2011-11-30 DIAGNOSIS — M48061 Spinal stenosis, lumbar region without neurogenic claudication: Secondary | ICD-10-CM

## 2011-11-30 DIAGNOSIS — IMO0002 Reserved for concepts with insufficient information to code with codable children: Secondary | ICD-10-CM | POA: Insufficient documentation

## 2011-11-30 DIAGNOSIS — M545 Low back pain, unspecified: Secondary | ICD-10-CM | POA: Insufficient documentation

## 2011-11-30 DIAGNOSIS — G894 Chronic pain syndrome: Secondary | ICD-10-CM

## 2011-12-02 NOTE — Assessment & Plan Note (Signed)
This is a patient of Dr. Wynn Banker', on his scheduled date due to time constraints, I saw her for him.  In mid November, he did right L5 dorsal ramus and right L3-4 medial branch radiofrequency neurotomy.  She states it helped about half of her pain go away.  She wants the left side done. She rates her average pain at a 4.  It is intermittent, sharp pain. General activity level is 3-4.  Pain is worse in the morning and evening.  Sleep patterns are fair.  Pain is worse with walking, bending, and standing.  Rest, heat, medication, and injections help.  She walks without assistance.  She can climb a few steps.  Functionally, she needs help with household duties.  REVIEW OF SYSTEMS:  Notable for difficulties as described above as well as some bladder control issues, weakness, spasms, and anxiety.  No suicidal thoughts or aberrant behaviors.  She did not bring her hydrocodone in for pill count today, but she states she has got plenty of it and does not need a refill.  PAST MEDICAL HISTORY/SOCIAL HISTORY/FAMILY HISTORY:  Unchanged.  PHYSICAL EXAMINATION:  VITAL SIGNS:  Blood pressure 121/58, pulse 78, respirations 16, and O2 sats 94 on room air. NEUROLOGIC:  Motor strength and sensation are intact. CONSTITUTIONAL:  She is within normal limits.  She is alert and oriented x3.  ASSESSMENT:  Lumbar stenosis and low back pain.  PLAN:  Continue her current medication regimen and she will follow up with Dr. Wynn Banker in a month for the injection on the left side and a medial branch block radiofrequency neurotomy.  Otherwise, her questions were encouraged and answered.     Demesha Donaldson L. Blima Dessert Electronically Signed    RLW/MedQ D:  11/30/2011 15:16:23  T:  12/02/2011 12:23:33  Job #:  [id number]

## 2011-12-25 DIAGNOSIS — Z79899 Other long term (current) drug therapy: Secondary | ICD-10-CM | POA: Insufficient documentation

## 2011-12-25 DIAGNOSIS — IMO0002 Reserved for concepts with insufficient information to code with codable children: Secondary | ICD-10-CM | POA: Insufficient documentation

## 2011-12-25 DIAGNOSIS — R609 Edema, unspecified: Secondary | ICD-10-CM | POA: Insufficient documentation

## 2011-12-27 ENCOUNTER — Encounter: Payer: Medicare Other | Attending: Physical Medicine & Rehabilitation

## 2011-12-27 ENCOUNTER — Ambulatory Visit: Payer: Medicare Other | Admitting: Physical Medicine & Rehabilitation

## 2011-12-27 DIAGNOSIS — IMO0002 Reserved for concepts with insufficient information to code with codable children: Secondary | ICD-10-CM | POA: Insufficient documentation

## 2011-12-27 DIAGNOSIS — M47817 Spondylosis without myelopathy or radiculopathy, lumbosacral region: Secondary | ICD-10-CM

## 2011-12-27 DIAGNOSIS — M545 Low back pain, unspecified: Secondary | ICD-10-CM | POA: Insufficient documentation

## 2011-12-27 NOTE — Procedures (Signed)
[name], [name]             ACCOUNT NO.:  [id number]  MEDICAL RECORD NO.:  [id number]           PATIENT TYPE:  O  LOCATION:  [facility]                          FACILITY:  [facility]  PHYSICIAN:  Erick Colace, M.D.DATE OF BIRTH:  1945-12-04  DATE OF PROCEDURE: DATE OF DISCHARGE:                              OPERATIVE REPORT  PROCEDURE:  This is a left L5 dorsal ramus, left L4 and left L3 medial branch radiofrequency neurotomy under fluoroscopic guidance.  INDICATION:  Lumbar spondylosis without myelopathy.  She has had good results with medial branch blocks at corresponding levels.  She has had a right-sided radiofrequency performed on October 04, 2011, here for the left side.  Informed consent was obtained after describing risks and benefits of the procedure with the patient.  These include bleeding, bruising, and infection.  She elects to proceed and has given written consent.  The patient placed prone on fluoroscopy table.  Betadine prep, sterile drape, 25-gauge inch and half needle was used to anesthetize skin, subcu tissue, 1% lidocaine x2 mL.  Then, a 20-gauge 10 cm RF needle with 10-mm curved active tip was inserted under fluoroscopic guidance, starting the right.  The left S1 SAP sacral ala junction, bone contact made, confirmed with lateral imaging.  Sensory stim at 50 Hz followed by motor stim at 2 Hz, confirmed proper needle location, followed by injection of 1 mL of dexamethasone lidocaine solution and radiofrequency lesioning 70 degrees Celsius for 70 seconds.  Then, the left L5 SAP transverse process junction targeted, bone contact made, confirmed with lateral imaging.  Sensory stim at 50 Hz followed by motor stim at 2 Hz, confirmed proper location followed by injection 1 mL of the dexamethasone lidocaine solution and radiofrequency lesioning 70 degrees Celsius for 90 seconds.  Then, the left L4 SAP transverse process junction targeted, bone contact made, confirmed  with lateral imaging. Sensory stim at 50 Hz followed by motor stim at 2 Hz, confirmed proper needle location, followed by injection of 1 mL of the dexamethasone lidocaine solution and radiofrequency lesioning 70 degrees Celsius for 90 seconds.  The patient tolerated procedure well.  Postprocedure instructions given.  She is to take 1800 mg of Neurontin tonight when she gets back.     Erick Colace, M.D. Electronically Signed    AEK/MEDQ  D:  12/27/2011 13:13:35  T:  12/27/2011 20:46:38  Job:  [id number]

## 2012-01-28 ENCOUNTER — Telehealth: Payer: Self-pay | Admitting: Physical Medicine & Rehabilitation

## 2012-01-28 MED ORDER — HYDROCODONE-ACETAMINOPHEN 5-325 MG PO TABS: 1.0000 | ORAL_TABLET | Freq: Four times a day (QID) | ORAL | Status: DC

## 2012-01-28 NOTE — Telephone Encounter (Signed)
Needs refill on Vicodin

## 2012-01-28 NOTE — Telephone Encounter (Signed)
Rx sent in, pt aware.  

## 2012-03-11 ENCOUNTER — Telehealth: Payer: Self-pay

## 2012-03-11 MED ORDER — HYDROCODONE-ACETAMINOPHEN 5-325 MG PO TABS: 1.0000 | ORAL_TABLET | Freq: Four times a day (QID) | ORAL | Status: DC

## 2012-03-11 NOTE — Telephone Encounter (Signed)
Pt requesting refill on hydrocodone.  [phone]

## 2012-03-11 NOTE — Telephone Encounter (Signed)
Rx called in, pt aware 

## 2012-03-25 ENCOUNTER — Encounter: Payer: Self-pay | Admitting: Physical Medicine & Rehabilitation

## 2012-03-25 ENCOUNTER — Encounter: Payer: Medicare Other | Attending: Physical Medicine & Rehabilitation

## 2012-03-25 ENCOUNTER — Ambulatory Visit (HOSPITAL_BASED_OUTPATIENT_CLINIC_OR_DEPARTMENT_OTHER): Payer: Medicare Other | Admitting: Physical Medicine & Rehabilitation

## 2012-03-25 VITALS — BP 136/39 | HR 80 | Resp 16 | Ht 63.5 in | Wt 164.0 lb

## 2012-03-25 DIAGNOSIS — M545 Low back pain, unspecified: Secondary | ICD-10-CM | POA: Insufficient documentation

## 2012-03-25 DIAGNOSIS — IMO0002 Reserved for concepts with insufficient information to code with codable children: Secondary | ICD-10-CM | POA: Insufficient documentation

## 2012-03-25 DIAGNOSIS — M47817 Spondylosis without myelopathy or radiculopathy, lumbosacral region: Secondary | ICD-10-CM

## 2012-03-25 DIAGNOSIS — M47816 Spondylosis without myelopathy or radiculopathy, lumbar region: Secondary | ICD-10-CM

## 2012-03-25 MED ORDER — HYDROCODONE-ACETAMINOPHEN 5-325 MG PO TABS: 1.0000 | ORAL_TABLET | Freq: Three times a day (TID) | ORAL | Status: DC | PRN

## 2012-03-25 NOTE — Progress Notes (Signed)
  Subjective:    Patient ID: [name], female    DOB: Jul 12, 1946, 66 y.o.   MRN: [id number]  HPI Off methotrexate and prednisone do to cataract surgery. 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 Patient complains of weakness in the legs. We discussed exercise program options. Overall aquatic exercise is unlikely to cause any problems with her arthritis and in fact should be beneficial as well as fields muscle strength and endurance. Pain Inventory Average Pain 4 Pain Right Now 6 My pain is constant, sharp and aching  In the last 24 hours, has pain interfered with the following? General activity 6 Relation with others 3 Enjoyment of life 4 What TIME of day is your pain at its worst? all the time Sleep (in general) Fair  Pain is worse with: walking, bending and standing Pain improves with: heat/ice, medication and injections Relief from Meds: 5  Mobility walk without assistance how many minutes can you walk? 30 ability to climb steps?  yes do you drive?  yes Do you have any goals in this area?  yes  Function retired  Neuro/Psych bladder control problems tingling trouble walking spasms anxiety  Prior Studies Any changes since last visit?  no  Physicians involved in your care Any changes since last visit?  no       Review of Systems  Constitutional: Negative.   HENT: Negative.   Eyes: Negative.   Respiratory: Positive for shortness of breath.   Cardiovascular: Negative.   Gastrointestinal: Positive for abdominal pain.  Genitourinary: Negative.   Musculoskeletal: Positive for joint swelling.  Skin: Negative.   Neurological: Negative.   Hematological: Negative.   Psychiatric/Behavioral: Negative.        Objective:   Physical Exam  Constitutional: She is oriented to person, place, and time. She appears well-developed and well-nourished.  Musculoskeletal:       Lumbar back: She exhibits decreased range of motion.       Pain with  lumbar extension greater than with flexion.  Neurological: She is alert and oriented to person, place, and time. No sensory deficit.       4/5 in UE and LE s  Psychiatric: She has a normal mood and affect.          Assessment & Plan:  1. Lumbar spondylosis without myelopathy. She's done well with radiofrequency procedures. The right side was last performed in November of 2012. It has worn off. We'll schedule for repeat. The left side is still doing well and was performed in February. I also recommend aquatic exercise. Followup with rheumatologist for her remitting sero negative for arthritis.

## 2012-03-25 NOTE — Patient Instructions (Signed)
Recommend [employer] arthritis aquatic program  Radiofrequency procedure right side next visit

## 2012-04-29 ENCOUNTER — Ambulatory Visit: Payer: Medicare Other | Admitting: Physical Medicine & Rehabilitation

## 2012-05-06 ENCOUNTER — Encounter: Payer: Self-pay | Admitting: Physical Medicine & Rehabilitation

## 2012-05-06 ENCOUNTER — Other Ambulatory Visit: Payer: Self-pay | Admitting: *Deleted

## 2012-05-06 ENCOUNTER — Encounter: Payer: Medicare Other | Attending: Physical Medicine & Rehabilitation

## 2012-05-06 ENCOUNTER — Ambulatory Visit (HOSPITAL_BASED_OUTPATIENT_CLINIC_OR_DEPARTMENT_OTHER): Payer: Medicare Other | Admitting: Physical Medicine & Rehabilitation

## 2012-05-06 VITALS — BP 158/62 | HR 87 | Resp 16 | Ht 63.5 in | Wt 164.0 lb

## 2012-05-06 DIAGNOSIS — M47817 Spondylosis without myelopathy or radiculopathy, lumbosacral region: Secondary | ICD-10-CM

## 2012-05-06 DIAGNOSIS — IMO0002 Reserved for concepts with insufficient information to code with codable children: Secondary | ICD-10-CM | POA: Insufficient documentation

## 2012-05-06 DIAGNOSIS — M545 Low back pain, unspecified: Secondary | ICD-10-CM | POA: Insufficient documentation

## 2012-05-06 DIAGNOSIS — M47816 Spondylosis without myelopathy or radiculopathy, lumbar region: Secondary | ICD-10-CM

## 2012-05-06 NOTE — Progress Notes (Signed)
  PROCEDURE RECORD [facility]   Name: [name] DOB:02/23/1946 MRN: [id number]  Date:05/06/2012  Physician: Claudette Laws, MD    Nurse/CMA: Noralyn Pick, CMA  Allergies:  Allergies  Allergen Reactions  . Penicillins   . Sulfonamide Derivatives     Consent Signed: yes  Is patient diabetic? no   Pregnant: no LMP: No LMP recorded. Patient has had a hysterectomy. (age [age])  Anticoagulants: no Anti-inflammatory: no Antibiotics: no  Procedure: Lumbar Radiofrequency  Position: Prone Start Time: 1:47pm  End Time: 2:16pm  Fluoro Time: 37 seconds  RN/CMA Hampton Abbot    Time 1:31pm 2:21pm    BP 158/62 165/62    Pulse 87 82    Respirations 16 16    O2 Sat 88% 85%    S/S 6 6    Pain Level 6/10 6/10     D/C home with [name] (daughter), patient A & O X 3, D/C instructions reviewed, and sits independently.

## 2012-05-06 NOTE — Progress Notes (Signed)
PROCEDURE: Right L5 dorsal ramus, right L4 and right L3 medial branch  radiofrequency neurotomy under fluoroscopic guidance.  INDICATION: Lumbar pain with pain only partially response to medication  management, other conservative care. Interfering with activities and  self-care. Pain is rated as 4/10 currently, increase it 3/08 with  certain activities. Pain has been improved by 100% on 2 occasions by  medial branch blocks at corresponding levels. She has been off her  Coumadin, okayed to do so by her cardiologist Dr. Virginia Rochester. INR 1.0 Informed consent was obtained after describing risks and benefits of the  procedure with the patient. These include bleeding, bruising, and  infection. She elects to proceed and has given written consent. The  patient placed prone on fluoroscopy table. Betadine prep, sterile  drape, 25-gauge inch and half needle was used to anesthetize skin and  subcu tissue, 1% lidocaine x2 mL at each of 3 sites. Then, a 20-gauge  10 cm RF needle with 10-mm curved active tip was inserted under  fluoroscopic guidance starting the right S1 SAP sacral ala junction,  bone contact made, confirmed with lateral imaging. Sensory stem at 50  Hz followed by motor stem at 2 Hz, confirmed proper needle location,  followed by injection 1 mL of solution containing 1 mL of 4 mg/mL  dexamethasone and 2 mL of 1% MPF lidocaine. Then, the right L5 SAP  transverse process junction targeted, bone contact made, confirmed with  lateral imaging. Sensory stem at 50 Hz followed by motor stem at 2 Hz,  confirmed proper needle location, followed by injection 1 mL of the  dexamethasone-lidocaine solution and radiofrequency lesioning 80 degrees  Celsius for 90 seconds. Then, the right L4 SAP transverse process  junction targeted, bone contact made, confirmed with lateral imaging.  Sensory stem at 50 Hz followed by motor stem at 2 Hz, confirmed proper  needle location followed by injection 1 mL of the  dexamethasone-  lidocaine solution and radiofrequency lesioning 80 degrees Celsius for  90 seconds. The patient tolerated procedure well. Postprocedure  instructions given.

## 2012-05-06 NOTE — Patient Instructions (Addendum)
Radiofrequency Lesioning Care After Refer to this sheet in the next few weeks. These instructions provide you with information on caring for yourself after your procedure. Your caregiver may also give you more specific instructions. Your treatment has been planned according to current medical practices, but problems sometimes occur. Call your caregiver if you have any problems or questions after your procedure. HOME CARE INSTRUCTIONS  Take pain medicine as directed by your caregiver.   You may have pain from the burned nerve for a while after the procedure. This takes time to heal. Ask your caregiver how long you should expect pain.   You should be able to return to your normal activities a couple of days after the procedure. When you can go back to work will depend on the type of work you do. Discuss this with your caregiver.   Pay close attention to how you feel after the procedure. If you start having pain, write down when it hurts and how it feels. This will help you and your caregiver know if you need another treatment.  SEEK MEDICAL CARE IF:  The site where the needle was inserted for the procedure becomes red, swollen, or tender to touch.   Your pain does not get better.  SEEK IMMEDIATE MEDICAL CARE IF:  You develop sudden, severe pain.   You develop numbness or tingling near the procedure site.   You have a fever.  MAKE SURE YOU:  Understand these instructions.   Will watch your condition.   Will get help right away if you are not doing well or get worse.  Document Released: 07/04/2011 Document Revised: 10/25/2011 Document Reviewed: 07/04/2011 [employer] Patient Information 2012 [employer]. 

## 2012-05-20 ENCOUNTER — Telehealth: Payer: Self-pay | Admitting: Physical Medicine & Rehabilitation

## 2012-05-20 MED ORDER — HYDROCODONE-ACETAMINOPHEN 5-325 MG PO TABS: 1.0000 | ORAL_TABLET | Freq: Three times a day (TID) | ORAL | Status: DC | PRN

## 2012-05-20 NOTE — Telephone Encounter (Signed)
Refill on Vicodin

## 2012-05-20 NOTE — Telephone Encounter (Signed)
Rx has been called in, pt aware. 

## 2012-06-09 ENCOUNTER — Encounter: Payer: Medicare Other | Attending: Physical Medicine & Rehabilitation

## 2012-06-09 ENCOUNTER — Encounter: Payer: Self-pay | Admitting: Physical Medicine & Rehabilitation

## 2012-06-09 ENCOUNTER — Ambulatory Visit (HOSPITAL_BASED_OUTPATIENT_CLINIC_OR_DEPARTMENT_OTHER): Payer: Medicare Other | Admitting: Physical Medicine & Rehabilitation

## 2012-06-09 VITALS — BP 143/54 | HR 87 | Resp 16 | Ht 63.5 in | Wt 160.4 lb

## 2012-06-09 DIAGNOSIS — M48061 Spinal stenosis, lumbar region without neurogenic claudication: Secondary | ICD-10-CM

## 2012-06-09 DIAGNOSIS — M545 Low back pain, unspecified: Secondary | ICD-10-CM | POA: Insufficient documentation

## 2012-06-09 DIAGNOSIS — IMO0002 Reserved for concepts with insufficient information to code with codable children: Secondary | ICD-10-CM | POA: Insufficient documentation

## 2012-06-09 NOTE — Progress Notes (Signed)
Subjective:    Patient ID: [name], female    DOB: 1946/11/10, 66 y.o.   MRN: [id number]  HPI  Last visit underwent lumbar medial branch radiofrequency neurotomy without significant relief Off methotrexate since cataract surgery.  Patient complains of weakness in the legs. We discussed exercise program options. Overall aquatic exercise is unlikely to cause any problems with her arthritis and in fact should be beneficial as well as fields muscle strength and endurance.   No other new issues Pain Inventory Average Pain 5 Pain Right Now 6 My pain is constant, dull, tingling and aching  In the last 24 hours, has pain interfered with the following? General activity 4 Relation with others 3 Enjoyment of life 4 What TIME of day is your pain at its worst? morning and evening Sleep (in general) Fair  Pain is worse with: walking, bending and standing Pain improves with: rest, heat/ice and medication Relief from Meds: 6  Mobility walk without assistance ability to climb steps?  yes do you drive?  yes  Function retired I need assistance with the following:  household duties and shopping  Neuro/Psych bladder control problems tingling spasms anxiety  Prior Studies Any changes since last visit?  no  Physicians involved in your care Any changes since last visit?  no   Family History  Problem Relation Age of Onset  . Heart disease Father   . Factor V Leiden deficiency Father   . Factor V Leiden deficiency Brother   . Factor V Leiden deficiency Daughter    History   Social History  . Marital Status: Married    Spouse Name: N/A    Number of Children: N/A  . Years of Education: N/A   Social History Main Topics  . Smoking status: Current Everyday Smoker  . Smokeless tobacco: Never Used  . Alcohol Use: No  . Drug Use: None  . Sexually Active: None   Other Topics Concern  . None   Social History Narrative  . None   Past Surgical History  Procedure Date    . Cataract extraction   . Cholecystectomy   . Abdominal hysterectomy   . Breast surgery    Past Medical History  Diagnosis Date  . Hypertension   . Arthritis   . Cataract   . COPD (chronic obstructive pulmonary disease)   . Factor 5 Leiden mutation, heterozygous   . Myocardial infarction    BP 143/54  Pulse 87  Resp 16  Ht 5' 3.5" (1.613 m)  Wt 160 lb 6.4 oz (72.757 kg)  BMI 27.97 kg/m2  SpO2 91%   Review of Systems  Respiratory: Positive for shortness of breath and wheezing.   Musculoskeletal: Positive for back pain.  Hematological: Bruises/bleeds easily.  All other systems reviewed and are negative.       Objective:   Physical Exam  Constitutional: She is oriented to person, place, and time. She appears well-developed and well-nourished.  Musculoskeletal:       Lumbar back: She exhibits decreased range of motion. She exhibits no tenderness, no edema, no deformity and no spasm.  Neurological: She is alert and oriented to person, place, and time. She has normal strength and normal reflexes. She displays no atrophy. No sensory deficit. Coordination and gait normal.  Psychiatric: She has a normal mood and affect.          Assessment & Plan:  1. Lumbar pain. Has evidence of spondylosis and good relief short-term with medial branch blocks however does not  have any long-term relief with the radiofrequency neurotomy. I would not recommend repeat Would continue current medication management Recommend therapy in swimming pool to increase mobility. Followup with primary M.D. For questions related to starting nortriptyline.

## 2012-06-09 NOTE — Patient Instructions (Signed)
You'll see my PA Clydie Braun in 3 months no change recommended care medications for pain. I do not think a repeat radiofrequency will be helpful given that the last one was not

## 2012-06-20 ENCOUNTER — Telehealth: Payer: Self-pay | Admitting: *Deleted

## 2012-06-20 MED ORDER — HYDROCODONE-ACETAMINOPHEN 5-325 MG PO TABS: 1.0000 | ORAL_TABLET | Freq: Three times a day (TID) | ORAL | Status: DC | PRN

## 2012-06-20 NOTE — Telephone Encounter (Signed)
Hydrocodone refill.   This has been called in for her.

## 2012-07-22 ENCOUNTER — Telehealth: Payer: Self-pay | Admitting: *Deleted

## 2012-07-22 MED ORDER — HYDROCODONE-ACETAMINOPHEN 5-325 MG PO TABS: 1.0000 | ORAL_TABLET | Freq: Three times a day (TID) | ORAL | Status: DC | PRN

## 2012-07-22 NOTE — Telephone Encounter (Signed)
Rx has been called in, pt aware. 

## 2012-07-22 NOTE — Telephone Encounter (Signed)
Hydrocodone refill. 

## 2012-08-19 ENCOUNTER — Telehealth: Payer: Self-pay | Admitting: Physical Medicine & Rehabilitation

## 2012-08-19 MED ORDER — HYDROCODONE-ACETAMINOPHEN 5-325 MG PO TABS: 1.0000 | ORAL_TABLET | Freq: Three times a day (TID) | ORAL | Status: DC | PRN

## 2012-08-19 NOTE — Telephone Encounter (Signed)
Refill on Vicodin

## 2012-08-19 NOTE — Telephone Encounter (Signed)
Rx has been called in, pt aware. 

## 2012-09-08 ENCOUNTER — Encounter
Payer: Medicare Other | Attending: Physical Medicine and Rehabilitation | Admitting: Physical Medicine and Rehabilitation

## 2012-09-08 ENCOUNTER — Encounter: Payer: Self-pay | Admitting: Physical Medicine and Rehabilitation

## 2012-09-08 VITALS — BP 153/53 | HR 88 | Resp 16 | Ht 63.0 in | Wt 162.0 lb

## 2012-09-08 DIAGNOSIS — Z5181 Encounter for therapeutic drug level monitoring: Secondary | ICD-10-CM

## 2012-09-08 DIAGNOSIS — M79609 Pain in unspecified limb: Secondary | ICD-10-CM | POA: Insufficient documentation

## 2012-09-08 DIAGNOSIS — M47816 Spondylosis without myelopathy or radiculopathy, lumbar region: Secondary | ICD-10-CM

## 2012-09-08 DIAGNOSIS — M549 Dorsalgia, unspecified: Secondary | ICD-10-CM

## 2012-09-08 DIAGNOSIS — M47817 Spondylosis without myelopathy or radiculopathy, lumbosacral region: Secondary | ICD-10-CM

## 2012-09-08 DIAGNOSIS — M542 Cervicalgia: Secondary | ICD-10-CM

## 2012-09-08 DIAGNOSIS — M545 Low back pain, unspecified: Secondary | ICD-10-CM | POA: Insufficient documentation

## 2012-09-08 NOTE — Patient Instructions (Signed)
Try to look into exercise groups in the water.

## 2012-09-08 NOTE — Progress Notes (Signed)
Subjective:    Patient ID: [name], female    DOB: 28-Nov-1945, 66 y.o.   MRN: [id number]  HPI The patient is a 66 year old female , who presents with LBP, which radiates into both of her LE . The symptoms started in 1985 after a fall . The patient complains about moderate pain .  Patient denies any numbness and tingling in her LE  .   Applying heat, or ice taking medications , changing positions alleviate the symptoms. Prolonged sitting and standing aggrevates the symptoms. The patient grades her pain as a 7 /10. The patient also complains about neck pain, which has gotten worse in the last 6 month.She denies any radiation into her UE.  Pain Inventory Average Pain 7 Pain Right Now 7 My pain is intermittent, sharp, tingling and aching  In the last 24 hours, has pain interfered with the following? General activity 6 Relation with others 4 Enjoyment of life 6 What TIME of day is your pain at its worst? varies Sleep (in general) Fair  Pain is worse with: walking, bending and standing Pain improves with: heat/ice, medication and injections Relief from Meds: 7  Mobility walk without assistance how many minutes can you walk? 5 ability to climb steps?  yes do you drive?  yes Do you have any goals in this area?  yes  Function retired I need assistance with the following:  shopping  Neuro/Psych bladder control problems tingling spasms anxiety  Prior Studies Any changes since last visit?  no  Physicians involved in your care Any changes since last visit?  no   Family History  Problem Relation Age of Onset  . Heart disease Father   . Factor V Leiden deficiency Father   . Factor V Leiden deficiency Brother   . Factor V Leiden deficiency Daughter    History   Social History  . Marital Status: Married    Spouse Name: N/A    Number of Children: N/A  . Years of Education: N/A   Social History Main Topics  . Smoking status: Current Every Day Smoker  . Smokeless  tobacco: Never Used  . Alcohol Use: No  . Drug Use: None  . Sexually Active: None   Other Topics Concern  . None   Social History Narrative  . None   Past Surgical History  Procedure Date  . Cataract extraction   . Cholecystectomy   . Abdominal hysterectomy   . Breast surgery    Past Medical History  Diagnosis Date  . Hypertension   . Arthritis   . Cataract   . COPD (chronic obstructive pulmonary disease)   . Factor 5 Leiden mutation, heterozygous   . Myocardial infarction    BP 153/53  Pulse 88  Resp 16  Ht 5\' 3"  (1.6 m)  Wt 162 lb (73.483 kg)  BMI 28.70 kg/m2  SpO2 67%     Review of Systems  HENT: Positive for neck pain.   Musculoskeletal: Positive for myalgias, back pain and arthralgias.  Psychiatric/Behavioral: The patient is nervous/anxious.   All other systems reviewed and are negative.       Objective:   Physical Exam  Constitutional: She is oriented to person, place, and time. She appears well-developed and well-nourished.  HENT:  Head: Normocephalic.  Neck: Neck supple.  Musculoskeletal: She exhibits tenderness.  Neurological: She is alert and oriented to person, place, and time.  Skin: Skin is warm and dry.  Psychiatric: She has a normal mood and affect.  Symmetric normal motor tone is noted throughout. Normal muscle bulk. Muscle testing reveals 5/5 muscle strength of the upper extremity, and 5/5 of the lower extremity, except right iliopsoas 4-/5. Full range of motion in upper and lower extremities. ROM of spine is restricted. Fine motor movements are normal in both hands. Sensory is intact and symmetric to light touch, pinprick and proprioception. DTR in the upper and lower extremity are present and symmetric 2+. No clonus is noted.  Patient arises from chair without difficulty. Narrow based gait with normal arm swing bilateral , able to walk on heels and toes . Tandem walk is possible , slightly unstable. No pronator drift. Rhomberg  negative.        Assessment & Plan:  1. Lumbar spondylosis,   Lumbar pain radiating into her LE bilateral, intermittendly. Has evidence of spondylosis and good relief short-term with medial branch blocks however does not have any long-term relief with the radiofrequency neurotomy. I would not recommend repeat  Would continue current medication management  Recommend therapy in swimming pool to increase mobility, patient does not really want to do this.  2. Neck pain, ordered X-rays, consider PT with modalities, depending on results. Educated patient about good posture. Follow up in 3 month, might order PT in between.

## 2012-09-12 ENCOUNTER — Telehealth: Payer: Self-pay

## 2012-09-12 NOTE — Telephone Encounter (Signed)
Message copied by Judd Gaudier on Fri Sep 12, 2012 12:53 PM ------      Message from: Su Monks      Created: Fri Sep 12, 2012  9:16 AM       UDS inconsistent, please ask patient whether she takes any migraine or headache medication, like fioricet, butalbital showed up in her UDS

## 2012-09-12 NOTE — Telephone Encounter (Signed)
Patient does take Fioricet and has taken it recently.

## 2012-09-18 ENCOUNTER — Telehealth: Payer: Self-pay | Admitting: Physical Medicine & Rehabilitation

## 2012-09-18 MED ORDER — HYDROCODONE-ACETAMINOPHEN 5-325 MG PO TABS: 1.0000 | ORAL_TABLET | Freq: Three times a day (TID) | ORAL | Status: DC | PRN

## 2012-09-18 NOTE — Telephone Encounter (Signed)
Refilled and [facility] notified.

## 2012-09-18 NOTE — Telephone Encounter (Signed)
Refill on Hydrocodone 

## 2012-10-20 ENCOUNTER — Telehealth: Payer: Self-pay | Admitting: *Deleted

## 2012-10-20 NOTE — Telephone Encounter (Signed)
I ordered the x-rays, but we never got any results

## 2012-10-20 NOTE — Telephone Encounter (Signed)
Needs to know results of xray done at [facility] on neck.

## 2012-10-21 NOTE — Telephone Encounter (Signed)
Please inform patient, x-rays just show mild degenerative changes, with small endplate spurs at Z6-1, C6-7, we can discuss this further at next visit.

## 2012-10-21 NOTE — Telephone Encounter (Signed)
Hillary obtained [employer]

## 2012-10-21 NOTE — Telephone Encounter (Signed)
Verified the place Mrs [name]'s xrays were taken.  She says it was [facility] in [place], a couple of weeks ago. Can you request these results Hillary?

## 2012-10-21 NOTE — Telephone Encounter (Signed)
Notified Katelyne.

## 2012-10-22 ENCOUNTER — Telehealth: Payer: Self-pay

## 2012-10-22 MED ORDER — HYDROCODONE-ACETAMINOPHEN 5-325 MG PO TABS: 1.0000 | ORAL_TABLET | Freq: Three times a day (TID) | ORAL | Status: DC | PRN

## 2012-10-22 NOTE — Telephone Encounter (Signed)
Following up on hydrocodone refill request.

## 2012-10-22 NOTE — Telephone Encounter (Signed)
Hydrocodone called in.  Patient aware.

## 2012-11-25 ENCOUNTER — Telehealth: Payer: Self-pay

## 2012-11-25 MED ORDER — HYDROCODONE-ACETAMINOPHEN 5-325 MG PO TABS: 1.0000 | ORAL_TABLET | Freq: Three times a day (TID) | ORAL | Status: DC | PRN

## 2012-11-25 NOTE — Telephone Encounter (Signed)
Patients daughter Kerry Dory called to get vicodin refilled.  Patient is not going to be able to make her appointment for the next month.  She has recently been dishcarged from the hospital and cannot travel.  She still has shortness of breath and is weak.  Please call vicodin into [facility].

## 2012-11-25 NOTE — Telephone Encounter (Signed)
Refilled, patient notified.

## 2012-12-09 ENCOUNTER — Ambulatory Visit: Payer: Medicare Other | Admitting: Physical Medicine and Rehabilitation

## 2012-12-29 ENCOUNTER — Telehealth: Payer: Self-pay

## 2012-12-29 NOTE — Telephone Encounter (Signed)
Patient called requesting vicodin refill.

## 2012-12-29 NOTE — Telephone Encounter (Signed)
Left message for patient to inform us what pharmacy her vicodin needs called in to.

## 2012-12-30 MED ORDER — HYDROCODONE-ACETAMINOPHEN 5-325 MG PO TABS: 1.0000 | ORAL_TABLET | Freq: Three times a day (TID) | ORAL | Status: DC | PRN

## 2012-12-30 NOTE — Telephone Encounter (Signed)
Pt notified of refill

## 2013-01-05 ENCOUNTER — Encounter: Payer: Self-pay | Admitting: Physical Medicine and Rehabilitation

## 2013-01-05 ENCOUNTER — Encounter
Payer: Medicare Other | Attending: Physical Medicine and Rehabilitation | Admitting: Physical Medicine and Rehabilitation

## 2013-01-05 VITALS — BP 140/71 | HR 59 | Resp 14 | Ht 63.0 in | Wt 167.0 lb

## 2013-01-05 DIAGNOSIS — J4489 Other specified chronic obstructive pulmonary disease: Secondary | ICD-10-CM | POA: Insufficient documentation

## 2013-01-05 DIAGNOSIS — M79609 Pain in unspecified limb: Secondary | ICD-10-CM | POA: Insufficient documentation

## 2013-01-05 DIAGNOSIS — M47812 Spondylosis without myelopathy or radiculopathy, cervical region: Secondary | ICD-10-CM

## 2013-01-05 DIAGNOSIS — M47817 Spondylosis without myelopathy or radiculopathy, lumbosacral region: Secondary | ICD-10-CM | POA: Insufficient documentation

## 2013-01-05 DIAGNOSIS — J449 Chronic obstructive pulmonary disease, unspecified: Secondary | ICD-10-CM | POA: Insufficient documentation

## 2013-01-05 DIAGNOSIS — M48061 Spinal stenosis, lumbar region without neurogenic claudication: Secondary | ICD-10-CM

## 2013-01-05 NOTE — Progress Notes (Signed)
Subjective:    Patient ID: [name], female    DOB: 1946/09/29, 67 y.o.   MRN: [id number]  HPI The patient is a 67 year old female , who presents with LBP, which radiates into both of her LE . The symptoms started in 1985 after a fall . The patient complains about moderate pain . Patient denies any numbness and tingling in her LE . Applying heat, or ice taking medications , changing positions alleviate the symptoms. Prolonged sitting and standing aggrevates the symptoms. The patient grades her pain as a 5 /10. The patient states that her neck pain has improved .  Pain Inventory Average Pain 5 Pain Right Now 5 My pain is constant, dull, tingling and aching  In the last 24 hours, has pain interfered with the following? General activity 3 Relation with others 3 Enjoyment of life 4 What TIME of day is your pain at its worst? morning Sleep (in general) Fair  Pain is worse with: walking, bending, sitting, inactivity and standing Pain improves with: rest, heat/ice, medication and injections Relief from Meds: 7  Mobility walk without assistance how many minutes can you walk? 30 ability to climb steps?  yes do you drive?  yes transfers alone Do you have any goals in this area?  yes  Function retired I need assistance with the following:  household duties and shopping  Neuro/Psych bladder control problems weakness tingling spasms anxiety  Prior Studies Any changes since last visit?  no  Physicians involved in your care Any changes since last visit?  no   Family History  Problem Relation Age of Onset  . Heart disease Father   . Factor V Leiden deficiency Father   . Factor V Leiden deficiency Brother   . Factor V Leiden deficiency Daughter    History   Social History  . Marital Status: Married    Spouse Name: N/A    Number of Children: N/A  . Years of Education: N/A   Social History Main Topics  . Smoking status: Former Games developer  . Smokeless tobacco: Never  Used  . Alcohol Use: No  . Drug Use: None  . Sexually Active: None   Other Topics Concern  . None   Social History Narrative  . None   Past Surgical History  Procedure Laterality Date  . Cataract extraction    . Cholecystectomy    . Abdominal hysterectomy    . Breast surgery     Past Medical History  Diagnosis Date  . Hypertension   . Arthritis   . Cataract   . COPD (chronic obstructive pulmonary disease)   . Factor 5 Leiden mutation, heterozygous   . Myocardial infarction    BP 140/71  Pulse 59  Resp 14  Ht 5\' 3"  (1.6 m)  Wt 167 lb (75.751 kg)  BMI 29.59 kg/m2  SpO2 95%     Review of Systems  HENT: Positive for neck pain.   Respiratory: Positive for cough, shortness of breath and wheezing.   Gastrointestinal: Positive for constipation.  Musculoskeletal: Positive for back pain.  Neurological: Positive for weakness.  Psychiatric/Behavioral: The patient is nervous/anxious.   All other systems reviewed and are negative.       Objective:   Physical Exam Constitutional: She is oriented to person, place, and time. She appears well-developed and well-nourished.  HENT:  Head: Normocephalic.  Neck: Neck supple.  Musculoskeletal: She exhibits tenderness.  Neurological: She is alert and oriented to person, place, and time.  Skin: Skin  is warm and dry.  Psychiatric: She has a normal mood and affect.  Symmetric normal motor tone is noted throughout. Normal muscle bulk. Muscle testing reveals 5/5 muscle strength of the upper extremity, and 5/5 of the lower extremity, except right iliopsoas 4-/5. Full range of motion in upper and lower extremities. ROM of spine is restricted. Fine motor movements are normal in both hands.  Sensory is intact and symmetric to light touch, pinprick and proprioception.  DTR in the upper and lower extremity are present and symmetric 2+. No clonus is noted.  Patient arises from chair without difficulty. Narrow based gait with normal arm  swing bilateral , able to walk on heels and toes . Tandem walk is possible , slightly unstable. No pronator drift. Rhomberg negative.        Assessment & Plan:  1. Lumbar spondylosis,  Lumbar pain radiating into her LE bilateral, intermittendly. Has evidence of spondylosis and good relief short-term with medial branch blocks however does not have any long-term relief with the radiofrequency neurotomy, not recommend repeat  Continue current medication management  Recommend therapy in swimming pool to increase mobility, patient does not really want to do this at this point.  2. Neck pain, has improved, ordered X-rays, which showed mild to moderate spondylosis. Consider PT with modalities. Educated patient about good posture.  COPD, exacerbation over the holidays, patient has quit smoking, and will start pulmonary rehab. I advised her to ask her therapist , whether she could also do some core exercises, for her back problem. Consider PT after she finishes with the pulmonary rehab. Follow up in 3 month.

## 2013-01-05 NOTE — Patient Instructions (Signed)
Stay as active as tolerated, start with your pulmonary rehab.

## 2013-01-28 ENCOUNTER — Telehealth: Payer: Self-pay

## 2013-01-28 MED ORDER — HYDROCODONE-ACETAMINOPHEN 5-325 MG PO TABS: 1.0000 | ORAL_TABLET | Freq: Three times a day (TID) | ORAL | Status: DC | PRN

## 2013-01-28 NOTE — Telephone Encounter (Signed)
Patient request hydrocodone refill.  Medication called in.  Patient aware.

## 2013-02-26 ENCOUNTER — Telehealth: Payer: Self-pay

## 2013-02-26 MED ORDER — HYDROCODONE-ACETAMINOPHEN 5-325 MG PO TABS: 1.0000 | ORAL_TABLET | Freq: Three times a day (TID) | ORAL | Status: DC | PRN

## 2013-02-26 NOTE — Telephone Encounter (Signed)
Patient called to request medication refill.  She [facility].  Medication called in.  Patient aware and reminded of her appointment.

## 2013-03-30 ENCOUNTER — Encounter: Payer: Self-pay | Admitting: Physical Medicine and Rehabilitation

## 2013-03-30 ENCOUNTER — Encounter
Payer: Medicare Other | Attending: Physical Medicine and Rehabilitation | Admitting: Physical Medicine and Rehabilitation

## 2013-03-30 VITALS — BP 160/78 | HR 56 | Resp 14

## 2013-03-30 DIAGNOSIS — M47817 Spondylosis without myelopathy or radiculopathy, lumbosacral region: Secondary | ICD-10-CM | POA: Insufficient documentation

## 2013-03-30 DIAGNOSIS — F17200 Nicotine dependence, unspecified, uncomplicated: Secondary | ICD-10-CM | POA: Insufficient documentation

## 2013-03-30 DIAGNOSIS — J4489 Other specified chronic obstructive pulmonary disease: Secondary | ICD-10-CM | POA: Insufficient documentation

## 2013-03-30 DIAGNOSIS — M545 Low back pain, unspecified: Secondary | ICD-10-CM | POA: Insufficient documentation

## 2013-03-30 DIAGNOSIS — M542 Cervicalgia: Secondary | ICD-10-CM | POA: Insufficient documentation

## 2013-03-30 DIAGNOSIS — Z79899 Other long term (current) drug therapy: Secondary | ICD-10-CM

## 2013-03-30 DIAGNOSIS — J449 Chronic obstructive pulmonary disease, unspecified: Secondary | ICD-10-CM | POA: Insufficient documentation

## 2013-03-30 DIAGNOSIS — Z5181 Encounter for therapeutic drug level monitoring: Secondary | ICD-10-CM

## 2013-03-30 DIAGNOSIS — I252 Old myocardial infarction: Secondary | ICD-10-CM | POA: Insufficient documentation

## 2013-03-30 DIAGNOSIS — I10 Essential (primary) hypertension: Secondary | ICD-10-CM | POA: Insufficient documentation

## 2013-03-30 DIAGNOSIS — M48061 Spinal stenosis, lumbar region without neurogenic claudication: Secondary | ICD-10-CM

## 2013-03-30 DIAGNOSIS — M47812 Spondylosis without myelopathy or radiculopathy, cervical region: Secondary | ICD-10-CM

## 2013-03-30 DIAGNOSIS — D6859 Other primary thrombophilia: Secondary | ICD-10-CM | POA: Insufficient documentation

## 2013-03-30 MED ORDER — HYDROCODONE-ACETAMINOPHEN 5-325 MG PO TABS: 1.0000 | ORAL_TABLET | Freq: Three times a day (TID) | ORAL | Status: DC | PRN

## 2013-03-30 NOTE — Patient Instructions (Signed)
Try the elliptical at the pulmonary rehab, do not do the crunches in a machine, in a sitting position. Please ask your PCP whether it would be ok for you to use Voltaren gel.

## 2013-03-30 NOTE — Progress Notes (Signed)
Subjective:    Patient ID: [name], female    DOB: 25-Sep-1946, 67 y.o.   MRN: [id number]  HPI The patient is a 67 year old female , who presents with LBP, which radiates into both of her LE . The symptoms started in 1985 after a fall . The patient complains about moderate pain . Patient denies any numbness and tingling in her LE . Applying heat, or ice taking medications , changing positions alleviate the symptoms. Prolonged sitting and standing aggrevates the symptoms. The patient grades her pain as a 7 /10. The patient states that her symptoms have increased since she is doing the pulmonary rehab.   Pain Inventory Average Pain 7 Pain Right Now 7 My pain is constant, sharp, stabbing and aching  In the last 24 hours, has pain interfered with the following? General activity 7 Relation with others 4 Enjoyment of life 8 What TIME of day is your pain at its worst? morning, day and evening Sleep (in general) Fair  Pain is worse with: walking, bending, standing and some activites Pain improves with: rest, heat/ice and medication Relief from Meds: 7  Mobility walk without assistance how many minutes can you walk? 15 ability to climb steps?  yes do you drive?  yes transfers alone  Function retired I need assistance with the following:  household duties and shopping  Neuro/Psych bladder control problems weakness anxiety  Prior Studies Any changes since last visit?  no  Physicians involved in your care Any changes since last visit?  no   Family History  Problem Relation Age of Onset  . Heart disease Father   . Factor V Leiden deficiency Father   . Factor V Leiden deficiency Brother   . Factor V Leiden deficiency Daughter    History   Social History  . Marital Status: Married    Spouse Name: N/A    Number of Children: N/A  . Years of Education: N/A   Social History Main Topics  . Smoking status: Current Some Day Smoker  . Smokeless tobacco: Never Used   Comment: Smokes e cigarettes  . Alcohol Use: No  . Drug Use: None  . Sexually Active: None   Other Topics Concern  . None   Social History Narrative  . None   Past Surgical History  Procedure Laterality Date  . Cataract extraction    . Cholecystectomy    . Abdominal hysterectomy    . Breast surgery     Past Medical History  Diagnosis Date  . Hypertension   . Arthritis   . Cataract   . COPD (chronic obstructive pulmonary disease)   . Factor 5 Leiden mutation, heterozygous   . Myocardial infarction    BP 160/78  Pulse 56  Resp 14  SpO2 96%     Review of Systems  Constitutional: Positive for unexpected weight change.  Genitourinary: Positive for difficulty urinating.  Neurological: Positive for weakness.  Psychiatric/Behavioral: The patient is nervous/anxious.   All other systems reviewed and are negative.       Objective:   Physical Exam Constitutional: She is oriented to person, place, and time. She appears well-developed and well-nourished.  HENT:  Head: Normocephalic.  Neck: Neck supple.  Musculoskeletal: She exhibits tenderness.  Neurological: She is alert and oriented to person, place, and time.  Skin: Skin is warm and dry.  Psychiatric: She has a normal mood and affect.  Symmetric normal motor tone is noted throughout. Normal muscle bulk. Muscle testing reveals 5/5 muscle  strength of the upper extremity, and 5/5 of the lower extremity, except right iliopsoas 4-/5. Full range of motion in upper and lower extremities. ROM of spine is restricted. Fine motor movements are normal in both hands.  Sensory is intact and symmetric to light touch, pinprick and proprioception.  DTR in the upper and lower extremity are present and symmetric 2+. No clonus is noted.  Patient arises from chair without difficulty. Narrow based gait with normal arm swing bilateral , able to walk on heels and toes . Tandem walk is possible , slightly unstable. No pronator drift. Rhomberg  negative.        Assessment & Plan:  1. Lumbar spondylosis,  Lumbar pain radiating into her LE bilateral, intermittendly. Has evidence of spondylosis and good relief short-term with medial branch blocks however does not have any long-term relief with the radiofrequency neurotomy, not recommend repeat  Continue current medication management  Recommend therapy in swimming pool to increase mobility, patient does not really want to do this at this point.  2. Neck pain, has improved, ordered X-rays, which showed mild to moderate spondylosis. Exacerbation of her symptoms, after doing the pulmonary rehab, discussed with patient which exercises she could do, and which she should avoid. Also recommended Voltaren gel for her inflamed joints, advised patient to ask her PCP, whether she could use Voltaren gel, she is on coumadin.  Consider PT with modalities. Educated patient about good posture.  3. COPD, patient has quit smoking, is doing pulmonary rehab Filled her Hydrocodone.  Follow up in 3 month.

## 2013-04-27 ENCOUNTER — Ambulatory Visit: Payer: Medicare Other | Admitting: Physical Medicine & Rehabilitation

## 2013-05-04 ENCOUNTER — Ambulatory Visit (HOSPITAL_BASED_OUTPATIENT_CLINIC_OR_DEPARTMENT_OTHER): Payer: Medicare Other | Admitting: Physical Medicine & Rehabilitation

## 2013-05-04 ENCOUNTER — Encounter: Payer: Self-pay | Admitting: Physical Medicine & Rehabilitation

## 2013-05-04 ENCOUNTER — Encounter: Payer: Medicare Other | Attending: Physical Medicine and Rehabilitation

## 2013-05-04 VITALS — BP 163/65 | HR 66 | Resp 16 | Ht 63.5 in | Wt 182.4 lb

## 2013-05-04 DIAGNOSIS — M76899 Other specified enthesopathies of unspecified lower limb, excluding foot: Secondary | ICD-10-CM

## 2013-05-04 DIAGNOSIS — I252 Old myocardial infarction: Secondary | ICD-10-CM | POA: Insufficient documentation

## 2013-05-04 DIAGNOSIS — M545 Low back pain, unspecified: Secondary | ICD-10-CM | POA: Insufficient documentation

## 2013-05-04 DIAGNOSIS — I10 Essential (primary) hypertension: Secondary | ICD-10-CM | POA: Insufficient documentation

## 2013-05-04 DIAGNOSIS — M47817 Spondylosis without myelopathy or radiculopathy, lumbosacral region: Secondary | ICD-10-CM | POA: Insufficient documentation

## 2013-05-04 DIAGNOSIS — M47812 Spondylosis without myelopathy or radiculopathy, cervical region: Secondary | ICD-10-CM

## 2013-05-04 DIAGNOSIS — J449 Chronic obstructive pulmonary disease, unspecified: Secondary | ICD-10-CM | POA: Insufficient documentation

## 2013-05-04 DIAGNOSIS — F17200 Nicotine dependence, unspecified, uncomplicated: Secondary | ICD-10-CM | POA: Insufficient documentation

## 2013-05-04 DIAGNOSIS — M48061 Spinal stenosis, lumbar region without neurogenic claudication: Secondary | ICD-10-CM

## 2013-05-04 DIAGNOSIS — M7061 Trochanteric bursitis, right hip: Secondary | ICD-10-CM

## 2013-05-04 DIAGNOSIS — D6859 Other primary thrombophilia: Secondary | ICD-10-CM | POA: Insufficient documentation

## 2013-05-04 DIAGNOSIS — M542 Cervicalgia: Secondary | ICD-10-CM | POA: Insufficient documentation

## 2013-05-04 DIAGNOSIS — J4489 Other specified chronic obstructive pulmonary disease: Secondary | ICD-10-CM | POA: Insufficient documentation

## 2013-05-04 MED ORDER — DICLOFENAC SODIUM 1 % TD GEL
2.0000 g | Freq: Four times a day (QID) | TRANSDERMAL | Status: DC
Start: 2013-05-04 — End: 2014-12-20

## 2013-05-04 NOTE — Progress Notes (Signed)
Subjective:    Patient ID: [name], female    DOB: 1945/12/03, 67 y.o.   MRN: [id number]  HPI  COPD exacerbation in the winter.  Hospitalized in [place].  Diagnosed with R sided Congestive Heart Failure Seen by a rheumatologist at [facility] was on prednisone up to 30mg /day, currently down to 10mg .  Pulmonary rehab exacerbated shoulder pain with arm ergometer Couldn't do treadmill, walking, bicycling,all of which irritated either back or hips   Pain Inventory Average Pain 7 Pain Right Now 7 My pain is constant, sharp, stabbing, tingling and aching  In the last 24 hours, has pain interfered with the following? General activity 6 Relation with others 5 Enjoyment of life 6 What TIME of day is your pain at its worst? daytime morning and evening Sleep (in general) Fair  Pain is worse with: walking, bending, sitting, standing and some activites Pain improves with: heat/ice, medication and injections Relief from Meds: 5  Mobility walk without assistance how many minutes can you walk? 15 do you drive?  yes  Function I need assistance with the following:  meal prep, household duties and shopping  Neuro/Psych bladder control problems weakness spasms anxiety  Prior Studies Any changes since last visit?  no  Physicians involved in your care Any changes since last visit?  no   Family History  Problem Relation Age of Onset  . Heart disease Father   . Factor V Leiden deficiency Father   . Factor V Leiden deficiency Brother   . Factor V Leiden deficiency Daughter    History   Social History  . Marital Status: Married    Spouse Name: N/A    Number of Children: N/A  . Years of Education: N/A   Social History Main Topics  . Smoking status: Current Some Day Smoker  . Smokeless tobacco: Never Used     Comment: Smokes e cigarettes  . Alcohol Use: No  . Drug Use: None  . Sexually Active: None   Other Topics Concern  . None   Social History Narrative  . None    Past Surgical History  Procedure Laterality Date  . Cataract extraction    . Cholecystectomy    . Abdominal hysterectomy    . Breast surgery     Past Medical History  Diagnosis Date  . Hypertension   . Arthritis   . Cataract   . COPD (chronic obstructive pulmonary disease)   . Factor 5 Leiden mutation, heterozygous   . Myocardial infarction    BP 163/65  Pulse 66  Resp 16  Ht 5' 3.5" (1.613 m)  Wt 182 lb 6.4 oz (82.736 kg)  BMI 31.8 kg/m2  SpO2 97%   Review of Systems  Constitutional: Positive for unexpected weight change.  Respiratory: Positive for shortness of breath and wheezing.   Cardiovascular: Positive for leg swelling.  Musculoskeletal: Positive for back pain, joint swelling, arthralgias and gait problem.  Hematological: Bruises/bleeds easily.  All other systems reviewed and are negative.       Objective:   Physical Exam  Nursing note and vitals reviewed. Constitutional: She is oriented to person, place, and time. She appears well-developed.  Moon face  HENT:  Head: Normocephalic and atraumatic.  Eyes: Conjunctivae and EOM are normal. Pupils are equal, round, and reactive to light.  Musculoskeletal:       Right hip: She exhibits decreased range of motion and tenderness. She exhibits no deformity.       Lumbar back: She exhibits decreased range of  motion, tenderness and deformity.  Thoracic kyphosis Negative straight-leg raising Pain to palpation bilateral L4 paraspinals.  Neurological: She is alert and oriented to person, place, and time. She has normal strength and normal reflexes. No sensory deficit. She exhibits normal muscle tone. Coordination normal.  Psychiatric: She has a normal mood and affect.          Assessment & Plan:  1. Lumbar spondylosis,  Lumbar pain radiating into her LE bilateral, intermittendly. Has evidence of spondylosis and good relief short-term with medial branch blocks however does not have any long-term relief with the  radiofrequency neurotomy, not recommend repeat  Continue current medication management  Recommend therapy in swimming pool to increase mobility, patient does not really want to do this at this point.  2. cervical spondylosis: X-rays showed mild to moderate spondylosis.  Exacerbation of her symptoms, after doing the pulmonary rehab, discussed with patient which exercises she could do, and which she should avoid. Also recommended Voltaren gel for her inflamed joints, , she is on coumadin                  3. back pain and hip pain Patient has pool.  Advised water walking to start with. Also can do hip abduction flexion and extension exercises holding onto the side of the pool Consider PT with modalities. Educated patient about good posture.   4. Trochanteric bursitis right side greater than left will inject the right side today  5. Right lower extremity shooting pain appears to be radicular. Reviewed CT myelogram from 2010. Shows potential L5 or S1 nerve root irritation however I would expect more problems on the left side than on the right side. Certainly things could change since 2010. We'll schedule for right L5-S1 transforaminal injection 4. COPD, patient has quit smoking, is doing pulmonary rehab  Filled her Hydrocodone.   Right Trochanteric bursa injection without ultrasound guidance  Indication Trochanteric bursitis. Exam has tenderness over the greater trochanter of the hip. Pain has not responded to conservative care such as exercise therapy and oral medications. Pain interferes with sleep or with mobility Informed consent was obtained after describing risks and benefits of the procedure with the patient these include bleeding bruising and infection. Patient has signed written consent form. Patient placed in a lateral decubitus position with the affected hip superior. Point of maximal pain was palpated marked and prepped with Betadine and entered with a 21 gauge 2 inch needle to bone  contact. Needle slightly withdrawn then 40 mg Depo-Medrol with 4 cc 1% lidocaine were injected. Patient tolerated procedure well. Post procedure instructions given.

## 2013-05-04 NOTE — Patient Instructions (Addendum)
Recommend walking in pool  Recommend hip exercises in pool  Recommend gentle shoulder exercises in pool, but no overhead shoulder exercises

## 2013-05-04 NOTE — Progress Notes (Deleted)
  Subjective:    Patient ID: [name], female    DOB: Mar 25, 1946, 67 y.o.   MRN: [id number]  HPI    Review of Systems     Objective:   Physical Exam        Assessment & Plan:

## 2013-06-09 ENCOUNTER — Ambulatory Visit: Payer: Medicare Other | Admitting: Physical Medicine & Rehabilitation

## 2013-06-11 ENCOUNTER — Encounter: Payer: Self-pay | Admitting: Physical Medicine & Rehabilitation

## 2013-06-11 ENCOUNTER — Ambulatory Visit (HOSPITAL_BASED_OUTPATIENT_CLINIC_OR_DEPARTMENT_OTHER): Payer: Medicare Other | Admitting: Physical Medicine & Rehabilitation

## 2013-06-11 ENCOUNTER — Encounter: Payer: Medicare Other | Attending: Physical Medicine and Rehabilitation

## 2013-06-11 VITALS — BP 154/64 | HR 60 | Resp 14 | Ht 63.5 in | Wt 180.2 lb

## 2013-06-11 DIAGNOSIS — IMO0002 Reserved for concepts with insufficient information to code with codable children: Secondary | ICD-10-CM

## 2013-06-11 DIAGNOSIS — M542 Cervicalgia: Secondary | ICD-10-CM | POA: Insufficient documentation

## 2013-06-11 DIAGNOSIS — M5416 Radiculopathy, lumbar region: Secondary | ICD-10-CM

## 2013-06-11 DIAGNOSIS — M545 Low back pain, unspecified: Secondary | ICD-10-CM | POA: Insufficient documentation

## 2013-06-11 DIAGNOSIS — J449 Chronic obstructive pulmonary disease, unspecified: Secondary | ICD-10-CM | POA: Insufficient documentation

## 2013-06-11 DIAGNOSIS — M47817 Spondylosis without myelopathy or radiculopathy, lumbosacral region: Secondary | ICD-10-CM | POA: Insufficient documentation

## 2013-06-11 DIAGNOSIS — D6859 Other primary thrombophilia: Secondary | ICD-10-CM | POA: Insufficient documentation

## 2013-06-11 DIAGNOSIS — I10 Essential (primary) hypertension: Secondary | ICD-10-CM | POA: Insufficient documentation

## 2013-06-11 DIAGNOSIS — F17200 Nicotine dependence, unspecified, uncomplicated: Secondary | ICD-10-CM | POA: Insufficient documentation

## 2013-06-11 DIAGNOSIS — J4489 Other specified chronic obstructive pulmonary disease: Secondary | ICD-10-CM | POA: Insufficient documentation

## 2013-06-11 DIAGNOSIS — I252 Old myocardial infarction: Secondary | ICD-10-CM | POA: Insufficient documentation

## 2013-06-11 MED ORDER — HYDROCODONE-ACETAMINOPHEN 5-325 MG PO TABS: 1.0000 | ORAL_TABLET | Freq: Three times a day (TID) | ORAL | Status: DC | PRN

## 2013-06-11 NOTE — Progress Notes (Signed)
  PROCEDURE RECORD [facility]   Name: [name] DOB:[birth year] MRN: [id number]  Date:06/11/2013  Physician: Claudette Laws, MD    Nurse/CMA: Gloris Ham  Allergies:  Allergies  Allergen Reactions  . Gabapentin Other (See Comments)    Lethargy, groggy, couldn't think straight, couldn't drive etc  . Penicillins   . Sulfonamide Derivatives     Consent Signed: yes  Is patient diabetic? no  CBG today?   Pregnant: no LMP: No LMP recorded. Patient has had a hysterectomy. (age 38-55)  Anticoagulants: yes (Coumadin --stopped 06/06/13)  INR 1.0 today Anti-inflammatory: no Antibiotics: no  Procedure: Transforaminal Lumbar Epidural Steroid Injection L 4-5 Position: Prone Start Time: 11:43 End Time:11:46  Fluoro Time: 17 seconds  RN/CMA Designer, multimedia    Time 10:58 11:51    BP 154/64 148/63    Pulse 60 61`    Respirations 14 14    O2 Sat 92 96    S/S 6 6    Pain Level 7/10 0/10     D/C home with [name], patient A & O X 3, D/C instructions reviewed, and sits independently.

## 2013-06-11 NOTE — Patient Instructions (Signed)

## 2013-06-11 NOTE — Progress Notes (Signed)
Lumbar right L4-L5 transforaminal epidural steroid injection under fluoroscopic guidance  Indication: Lumbosacral radiculitis is not relieved by medication management or other conservative care and interfering with self-care and mobility.   Informed consent was obtained after describing risk and benefits of the procedure with the patient, this includes bleeding, bruising, infection, paralysis and medication side effects.  The patient wishes to proceed and has given written consent.  Patient was placed in prone position.  The lumbar area was marked and prepped with Betadine.  It was entered with a 25-gauge 1-1/2 inch needle and one mL of 1% lidocaine was injected into the skin and subcutaneous tissue.  Then a 22-gauge 3.5 inch spinal needle was inserted into the Right L4-L5 intervertebral foramen under AP, lateral, and oblique view.  Then a solution containing one mL of 10 mg per mL dexamethasone and 2 mL of 1% lidocaine was injected.  The patient tolerated procedure well.  Post procedure instructions were given.  Please see post procedure form.

## 2013-06-30 ENCOUNTER — Ambulatory Visit: Payer: Medicare Other | Admitting: Physical Medicine & Rehabilitation

## 2013-07-21 ENCOUNTER — Other Ambulatory Visit: Payer: Self-pay

## 2013-07-21 MED ORDER — HYDROCODONE-ACETAMINOPHEN 5-325 MG PO TABS: 1.0000 | ORAL_TABLET | Freq: Three times a day (TID) | ORAL | Status: DC | PRN

## 2013-07-21 NOTE — Telephone Encounter (Signed)
Patient called requesting hydrocodone refill.  This was called in and patient aware of FDA changes to medications.

## 2013-08-10 ENCOUNTER — Encounter: Payer: Medicare Other | Attending: Physical Medicine and Rehabilitation

## 2013-08-10 ENCOUNTER — Ambulatory Visit (HOSPITAL_BASED_OUTPATIENT_CLINIC_OR_DEPARTMENT_OTHER): Payer: Medicare Other | Admitting: Physical Medicine & Rehabilitation

## 2013-08-10 ENCOUNTER — Encounter: Payer: Self-pay | Admitting: Physical Medicine & Rehabilitation

## 2013-08-10 ENCOUNTER — Ambulatory Visit (HOSPITAL_COMMUNITY)
Admission: RE | Admit: 2013-08-10 | Discharge: 2013-08-10 | Disposition: A | Discharge status: Home / Self Care | Payer: Medicare Other | Source: Ambulatory Visit | Attending: Physical Medicine & Rehabilitation | Admitting: Physical Medicine & Rehabilitation

## 2013-08-10 VITALS — BP 149/55 | HR 60 | Resp 16 | Ht 63.0 in | Wt 177.0 lb

## 2013-08-10 DIAGNOSIS — M25559 Pain in unspecified hip: Secondary | ICD-10-CM

## 2013-08-10 DIAGNOSIS — J449 Chronic obstructive pulmonary disease, unspecified: Secondary | ICD-10-CM | POA: Insufficient documentation

## 2013-08-10 DIAGNOSIS — I10 Essential (primary) hypertension: Secondary | ICD-10-CM | POA: Diagnosis not present

## 2013-08-10 DIAGNOSIS — F17200 Nicotine dependence, unspecified, uncomplicated: Secondary | ICD-10-CM | POA: Diagnosis not present

## 2013-08-10 DIAGNOSIS — M545 Low back pain, unspecified: Secondary | ICD-10-CM | POA: Insufficient documentation

## 2013-08-10 DIAGNOSIS — M48061 Spinal stenosis, lumbar region without neurogenic claudication: Secondary | ICD-10-CM

## 2013-08-10 DIAGNOSIS — G8929 Other chronic pain: Secondary | ICD-10-CM

## 2013-08-10 DIAGNOSIS — M47817 Spondylosis without myelopathy or radiculopathy, lumbosacral region: Secondary | ICD-10-CM | POA: Diagnosis not present

## 2013-08-10 DIAGNOSIS — D6859 Other primary thrombophilia: Secondary | ICD-10-CM | POA: Diagnosis not present

## 2013-08-10 DIAGNOSIS — I252 Old myocardial infarction: Secondary | ICD-10-CM | POA: Diagnosis not present

## 2013-08-10 DIAGNOSIS — M542 Cervicalgia: Secondary | ICD-10-CM | POA: Diagnosis not present

## 2013-08-10 DIAGNOSIS — J4489 Other specified chronic obstructive pulmonary disease: Secondary | ICD-10-CM | POA: Insufficient documentation

## 2013-08-10 MED ORDER — HYDROCODONE-ACETAMINOPHEN 5-325 MG PO TABS: 1.0000 | ORAL_TABLET | Freq: Three times a day (TID) | ORAL | Status: DC | PRN

## 2013-08-10 NOTE — Progress Notes (Signed)
Subjective:    Patient ID: [name], female    DOB: Dec 03, 1945, 67 y.o.   MRN: [id number]  HPI July 24,2014 Lumbar right L4-L5 transforaminal epidural steroid injection under fluoroscopic guidance was not helpful. Chronic renal insufficiency Chronic LE swelling Pain Inventory Average Pain 7 Pain Right Now 8 My pain is constant, sharp, burning, dull and aching  In the last 24 hours, has pain interfered with the following? General activity 6 Relation with others 6 Enjoyment of life 6 What TIME of day is your pain at its worst? morning and evening Sleep (in general) Fair  Pain is worse with: walking, bending and standing Pain improves with: rest, heat/ice and medication Relief from Meds: 7  Mobility walk without assistance how many minutes can you walk? 15 ability to climb steps?  no do you drive?  yes transfers alone Do you have any goals in this area?  yes  Function retired I need assistance with the following:  household duties and shopping  Neuro/Psych bladder control problems weakness trouble walking spasms anxiety  Prior Studies Any changes since last visit?  no 07/05/2009 CT myelogram Findings: No compressive abnormality is seen at L3-4 or above.  There are small anterior extradural defects at L2-3 and L3-4. At  L4-5, there is mild narrowing of the lateral recesses, more on the  left. The left L5 nerve root could be affected. This appears  slightly worsened with standing. There is mild narrowing of the  lateral recesses at L5-S1. Flexion and extension views show  perhaps 1 mm of anterolisthesis of L4-5 with flexion.  IMPRESSION:  Lateral recess narrowing at L4-5 left more than right. This  worsens with standing. 1 mm of anterolisthesis at this level with  flexion.  Mild narrowing of the lateral recesses at L5-S1.  Physicians involved in your care Any changes since last visit?  no   Family History  Problem Relation Age of Onset  . Heart  disease Father   . Factor V Leiden deficiency Father   . Factor V Leiden deficiency Brother   . Factor V Leiden deficiency Daughter    History   Social History  . Marital Status: Married    Spouse Name: N/A    Number of Children: N/A  . Years of Education: N/A   Social History Main Topics  . Smoking status: Current Some Day Smoker  . Smokeless tobacco: Never Used     Comment: Smokes e cigarettes  . Alcohol Use: No  . Drug Use: None  . Sexual Activity: None   Other Topics Concern  . None   Social History Narrative  . None   Past Surgical History  Procedure Laterality Date  . Cataract extraction    . Cholecystectomy    . Abdominal hysterectomy    . Breast surgery     Past Medical History  Diagnosis Date  . Hypertension   . Arthritis   . Cataract   . COPD (chronic obstructive pulmonary disease)   . Factor 5 Leiden mutation, heterozygous   . Myocardial infarction    BP 149/55  Pulse 60  Resp 16  Ht 5\' 3"  (1.6 m)  Wt 177 lb (80.287 kg)  BMI 31.36 kg/m2  SpO2 93%     Review of Systems  Genitourinary: Positive for difficulty urinating.  Musculoskeletal: Positive for back pain and gait problem.  Neurological: Positive for weakness.  Psychiatric/Behavioral: The patient is nervous/anxious.   All other systems reviewed and are negative.  Objective:   Physical Exam  Nursing note and vitals reviewed. Constitutional: She is oriented to person, place, and time. She appears well-developed and well-nourished.  HENT:  Head: Normocephalic and atraumatic.  Musculoskeletal:  R hip reduced internal rotation  Neurological: She is alert and oriented to person, place, and time. She has normal reflexes. No sensory deficit.  Psychiatric: She has a normal mood and affect.   Lumbar spine with reduced range of motion flexion extension lateral rotation and bending all approximately 50% of normal Lower extremity strength is normal with hip flexion knee extension ankle  dorsiflexion and plantarflexion  X-rays of the pelvis and hips demonstrate decreased joint space right greater than left femoral acetabular joint. Osteophyte formation right acetabular greater than left. No evidence of subchondral sclerosis. No evidence of fracture in the pelvis or femoral area    Assessment & Plan:  1.  hip pain and back pain. Has a combination of spinal stenosis with hip osteoarthritis. Continue hydrocodone. Patient informed that this is now scheduled II  Consider hip joint injection either under ultrasound or fluoroscopic guidance.  Return to clinic one month to discuss treatment options. May benefit from physical therapy as well

## 2013-08-10 NOTE — Patient Instructions (Signed)
Please get hip xrays today so we can discuss them at our visit next month   Hydrocodone is now classified by the DEA as a Schedule II mediciation.  This is the same class as Oxycodone, Morphine, Fentanyl, and other medications. Monthly written prescriptions are required. Hydrocodone alternatives that don't require written prescriptions include.  Tylenol with Codeine Butrans patch Tramadol

## 2013-09-04 ENCOUNTER — Encounter: Payer: Medicare Other | Attending: Physical Medicine and Rehabilitation

## 2013-09-04 ENCOUNTER — Encounter: Payer: Self-pay | Admitting: Physical Medicine & Rehabilitation

## 2013-09-04 ENCOUNTER — Ambulatory Visit (HOSPITAL_BASED_OUTPATIENT_CLINIC_OR_DEPARTMENT_OTHER): Payer: Medicare Other | Admitting: Physical Medicine & Rehabilitation

## 2013-09-04 VITALS — BP 162/55 | HR 71 | Resp 14 | Ht 63.0 in | Wt 176.0 lb

## 2013-09-04 DIAGNOSIS — F17200 Nicotine dependence, unspecified, uncomplicated: Secondary | ICD-10-CM | POA: Insufficient documentation

## 2013-09-04 DIAGNOSIS — J449 Chronic obstructive pulmonary disease, unspecified: Secondary | ICD-10-CM | POA: Insufficient documentation

## 2013-09-04 DIAGNOSIS — M545 Low back pain, unspecified: Secondary | ICD-10-CM | POA: Insufficient documentation

## 2013-09-04 DIAGNOSIS — M353 Polymyalgia rheumatica: Secondary | ICD-10-CM

## 2013-09-04 DIAGNOSIS — M542 Cervicalgia: Secondary | ICD-10-CM | POA: Insufficient documentation

## 2013-09-04 DIAGNOSIS — M48062 Spinal stenosis, lumbar region with neurogenic claudication: Secondary | ICD-10-CM

## 2013-09-04 DIAGNOSIS — G8929 Other chronic pain: Secondary | ICD-10-CM

## 2013-09-04 DIAGNOSIS — Z79899 Other long term (current) drug therapy: Secondary | ICD-10-CM

## 2013-09-04 DIAGNOSIS — D6859 Other primary thrombophilia: Secondary | ICD-10-CM | POA: Insufficient documentation

## 2013-09-04 DIAGNOSIS — Z5181 Encounter for therapeutic drug level monitoring: Secondary | ICD-10-CM

## 2013-09-04 DIAGNOSIS — I252 Old myocardial infarction: Secondary | ICD-10-CM | POA: Insufficient documentation

## 2013-09-04 DIAGNOSIS — J4489 Other specified chronic obstructive pulmonary disease: Secondary | ICD-10-CM | POA: Insufficient documentation

## 2013-09-04 DIAGNOSIS — M25559 Pain in unspecified hip: Secondary | ICD-10-CM

## 2013-09-04 DIAGNOSIS — I10 Essential (primary) hypertension: Secondary | ICD-10-CM | POA: Insufficient documentation

## 2013-09-04 DIAGNOSIS — M47817 Spondylosis without myelopathy or radiculopathy, lumbosacral region: Secondary | ICD-10-CM | POA: Insufficient documentation

## 2013-09-04 MED ORDER — HYDROCODONE-ACETAMINOPHEN 5-325 MG PO TABS: 1.0000 | ORAL_TABLET | Freq: Three times a day (TID) | ORAL | Status: DC | PRN

## 2013-09-04 NOTE — Progress Notes (Signed)
Subjective:    Patient ID: [name], female    DOB: [birth year], 67 y.o.   MRN: [id number]  HPI July 24,2014 Lumbar right L4-L5 transforaminal epidural steroid injection under fluoroscopic guidance was not helpful.  Chronic renal insufficiency  Chronic LE swelling R hip doing better Diagnosed with PMR Pain Inventory Average Pain 6 Pain Right Now 6 My pain is constant, sharp, burning, dull, tingling and aching  In the last 24 hours, has pain interfered with the following? General activity 5 Relation with others 4 Enjoyment of life 5 What TIME of day is your pain at its worst? morning, evening Sleep (in general) Fair  Pain is worse with: walking, bending and standing Pain improves with: rest, heat/ice and medication Relief from Meds: 7  Mobility walk without assistance how many minutes can you walk? 15 ability to climb steps?  no do you drive?  yes transfers alone Do you have any goals in this area?  yes  Function retired I need assistance with the following:  household duties and shopping Do you have any goals in this area?  yes  Neuro/Psych bladder control problems weakness tingling spasms anxiety  Prior Studies Any changes since last visit?  yes  Physicians involved in your care Any changes since last visit?  no   Family History  Problem Relation Age of Onset  . Heart disease Father   . Factor V Leiden deficiency Father   . Factor V Leiden deficiency Brother   . Factor V Leiden deficiency Daughter    History   Social History  . Marital Status: Married    Spouse Name: N/A    Number of Children: N/A  . Years of Education: N/A   Social History Main Topics  . Smoking status: Current Some Day Smoker  . Smokeless tobacco: Never Used     Comment: Smokes e cigarettes  . Alcohol Use: No  . Drug Use: Not on file  . Sexual Activity: Not on file   Other Topics Concern  . Not on file   Social History Narrative  . No narrative on file   Past  Surgical History  Procedure Laterality Date  . Cataract extraction    . Cholecystectomy    . Abdominal hysterectomy    . Breast surgery     Past Medical History  Diagnosis Date  . Hypertension   . Arthritis   . Cataract   . COPD (chronic obstructive pulmonary disease)   . Factor 5 Leiden mutation, heterozygous   . Myocardial infarction    There were no vitals taken for this visit.     Review of Systems  Constitutional: Positive for unexpected weight change.  Respiratory: Positive for shortness of breath and wheezing.   Gastrointestinal: Positive for constipation.  Genitourinary:       Bladder control problems  Neurological: Positive for weakness.       Tingling, spasms  Hematological: Bruises/bleeds easily.  Psychiatric/Behavioral: The patient is nervous/anxious.   All other systems reviewed and are negative.       Objective:   Physical Exam HENT:  Head: Normocephalic and atraumatic.  Musculoskeletal:  R hip reduced internal rotation  Neurological: She is alert and oriented to person, place, and time. She has normal reflexes. No sensory deficit.  Psychiatric: She has a normal mood and affect.   Lumbar spine with reduced range of motion flexion extension lateral rotation and bending all approximately 50% of normal  Lower extremity strength is normal with hip flexion knee extension  ankle dorsiflexion and plantarflexion      Assessment & Plan:  1. hip pain and back pain. Has a combination of spinal stenosis with hip osteoarthritis. Continue hydrocodone. Patient infor. med that this is now schedule II, patient is considering Butrans patch since it is a schedule 3 narcotic analgesic Hip joint pain improved. This may be related to PMR, now on prednisone Return to clinic one month to discuss treatment options. May benefit from physical therapy as well, have written referral to work on lower extremity strength as well as balance Sees rheumatology for PMR as well.

## 2013-09-04 NOTE — Patient Instructions (Signed)
Went over alternatives to hydrocodone.

## 2013-09-28 ENCOUNTER — Telehealth: Payer: Self-pay

## 2013-09-28 NOTE — Progress Notes (Signed)
Please do [place] pharmacy check

## 2013-09-28 NOTE — Telephone Encounter (Signed)
Message copied by Judd Gaudier on Mon Sep 28, 2013  3:15 PM ------      Message from: Su Monks      Created: Mon Sep 28, 2013  2:37 PM       Please ask patient for an explanation, then I will let Dr. Doroteo Bradford know ------

## 2013-09-28 NOTE — Telephone Encounter (Signed)
Tried to contact patient regarding phenobarbital in [facility] but no answer.

## 2013-10-02 ENCOUNTER — Encounter: Payer: Medicare Other | Admitting: Physical Medicine and Rehabilitation

## 2013-10-30 ENCOUNTER — Encounter: Payer: Self-pay | Admitting: Physical Medicine & Rehabilitation

## 2013-10-30 ENCOUNTER — Encounter: Payer: Medicare Other | Attending: Physical Medicine and Rehabilitation

## 2013-10-30 ENCOUNTER — Ambulatory Visit (HOSPITAL_BASED_OUTPATIENT_CLINIC_OR_DEPARTMENT_OTHER): Payer: Medicare Other | Admitting: Physical Medicine & Rehabilitation

## 2013-10-30 VITALS — BP 153/68 | HR 79 | Resp 16 | Ht 63.0 in | Wt 177.0 lb

## 2013-10-30 DIAGNOSIS — M545 Low back pain, unspecified: Secondary | ICD-10-CM | POA: Insufficient documentation

## 2013-10-30 DIAGNOSIS — I639 Cerebral infarction, unspecified: Secondary | ICD-10-CM

## 2013-10-30 DIAGNOSIS — M47817 Spondylosis without myelopathy or radiculopathy, lumbosacral region: Secondary | ICD-10-CM | POA: Insufficient documentation

## 2013-10-30 DIAGNOSIS — D6859 Other primary thrombophilia: Secondary | ICD-10-CM | POA: Insufficient documentation

## 2013-10-30 DIAGNOSIS — J449 Chronic obstructive pulmonary disease, unspecified: Secondary | ICD-10-CM | POA: Insufficient documentation

## 2013-10-30 DIAGNOSIS — J4489 Other specified chronic obstructive pulmonary disease: Secondary | ICD-10-CM | POA: Insufficient documentation

## 2013-10-30 DIAGNOSIS — M48061 Spinal stenosis, lumbar region without neurogenic claudication: Secondary | ICD-10-CM

## 2013-10-30 DIAGNOSIS — I10 Essential (primary) hypertension: Secondary | ICD-10-CM | POA: Insufficient documentation

## 2013-10-30 DIAGNOSIS — I252 Old myocardial infarction: Secondary | ICD-10-CM | POA: Insufficient documentation

## 2013-10-30 DIAGNOSIS — F17200 Nicotine dependence, unspecified, uncomplicated: Secondary | ICD-10-CM | POA: Insufficient documentation

## 2013-10-30 DIAGNOSIS — I6350 Cerebral infarction due to unspecified occlusion or stenosis of unspecified cerebral artery: Secondary | ICD-10-CM

## 2013-10-30 DIAGNOSIS — M542 Cervicalgia: Secondary | ICD-10-CM | POA: Insufficient documentation

## 2013-10-30 MED ORDER — HYDROCODONE-ACETAMINOPHEN 5-325 MG PO TABS: 1.0000 | ORAL_TABLET | Freq: Three times a day (TID) | ORAL | Status: DC | PRN

## 2013-10-30 NOTE — Patient Instructions (Addendum)
Do not exceed 3 times a day Hydrocodone 5mg  three times a day  Dr Percell Boston is a PM&R MD in [facility] if you need to be closer to home

## 2013-10-30 NOTE — Progress Notes (Signed)
Subjective:    Patient ID: [name], female    DOB: [birth year], 67 y.o.   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 Has had expressive aphasia. Bilateral upper outer quadrant field loss. No balance issues. PT OT and speech therapy home health. Also had myocardial infarction, daughter thinks it was ST elevation MI. Elevated troponins Patient accompanied by her daughter.  Pain Inventory Average Pain 5 Pain Right Now 7  My pain is constant, burning, dull, tingling and aching  In the last 24 hours, has pain interfered with the following? General activity 5 Relation with others 3 Enjoyment of life 5 What TIME of day is your pain at its worst? daytime Sleep (in general) Fair  Pain is worse with: walking, bending, sitting, standing and some activites Pain improves with: rest, heat/ice and medication Relief from Meds: 7  Mobility walk without assistance how many minutes can you walk? 30 ability to climb steps?  yes do you drive?  no transfers alone Do you have any goals in this area?  yes  Function retired I need assistance with the following:  household duties and shopping Do you have any goals in this area?  yes  Neuro/Psych bladder control problems weakness numbness tingling spasms confusion anxiety  Prior Studies CT/MRI/patient had an occular stroke and a heart attack  Physicians involved in your care Any changes since last visit?  no   Family History  Problem Relation Age of Onset  . Heart disease Father   . Factor V Leiden deficiency Father   . Factor V Leiden deficiency Brother   . Factor V Leiden deficiency Daughter    History   Social History  . Marital Status: Married    Spouse Name: N/A    Number of Children: N/A  . Years of Education: N/A   Social History Main Topics  . Smoking status: Current Some Day Smoker  . Smokeless tobacco: Never Used     Comment: Smokes e cigarettes  . Alcohol Use: No  . Drug Use:  None  . Sexual Activity: None   Other Topics Concern  . None   Social History Narrative  . None   Past Surgical History  Procedure Laterality Date  . Cataract extraction    . Cholecystectomy    . Abdominal hysterectomy    . Breast surgery     Past Medical History  Diagnosis Date  . Hypertension   . Arthritis   . Cataract   . COPD (chronic obstructive pulmonary disease)   . Factor 5 Leiden mutation, heterozygous   . Myocardial infarction    BP 153/68  Pulse 79  Resp 16  Ht 5\' 3"  (1.6 m)  Wt 177 lb (80.287 kg)  BMI 31.36 kg/m2  SpO2 90%     Review of Systems  Respiratory: Positive for shortness of breath and wheezing.   Gastrointestinal: Positive for constipation.  Genitourinary: Positive for difficulty urinating.  Neurological: Positive for weakness and numbness.  All other systems reviewed and are negative.       Objective:   Physical Exam Right upper outer quadrant field cut Motor strength 5/5 bilateral deltoid, bicep, tricep, grip, hip flexor, knee extensors, ankle dysfunction plantar flexor Sensation intact to light touch bilateral upper extremity Speech able to repeat no ifs, ands, or buts Mild word finding deficit        Assessment & Plan:  1. History of lumbar spinal stenosis deficits are stable. Continue hydrocodone 3 times per day. Cautioned  about the increased incidence of side effects once patient has had a CVA  2. Recent left occipital infarct, history of factor V 5 Leiden deficiency on chronic Coumadin, now switched from aspirin to Plavix, will be starting physical therapy for this as well as OT and speech  3. History recent myocardial infarction may experience some deconditioning related to this.

## 2013-11-26 ENCOUNTER — Other Ambulatory Visit: Payer: Self-pay | Admitting: *Deleted

## 2013-11-26 MED ORDER — HYDROCODONE-ACETAMINOPHEN 5-325 MG PO TABS: 1.0000 | ORAL_TABLET | Freq: Three times a day (TID) | ORAL | Status: DC | PRN

## 2013-11-26 NOTE — Telephone Encounter (Signed)
RX printed early for controlled medication for the visit with RN on 11/30/13 (to be signed by MD) 

## 2013-11-30 ENCOUNTER — Encounter: Payer: Medicare Other | Admitting: *Deleted

## 2013-12-07 ENCOUNTER — Encounter: Payer: Medicare Other | Attending: Physical Medicine & Rehabilitation | Admitting: *Deleted

## 2013-12-07 ENCOUNTER — Encounter: Payer: Self-pay | Admitting: *Deleted

## 2013-12-07 VITALS — BP 155/54 | HR 68 | Resp 14 | Wt 177.6 lb

## 2013-12-07 DIAGNOSIS — Z79899 Other long term (current) drug therapy: Secondary | ICD-10-CM | POA: Insufficient documentation

## 2013-12-07 DIAGNOSIS — M47812 Spondylosis without myelopathy or radiculopathy, cervical region: Secondary | ICD-10-CM | POA: Insufficient documentation

## 2013-12-07 DIAGNOSIS — Z7901 Long term (current) use of anticoagulants: Secondary | ICD-10-CM | POA: Insufficient documentation

## 2013-12-07 DIAGNOSIS — I69920 Aphasia following unspecified cerebrovascular disease: Secondary | ICD-10-CM | POA: Insufficient documentation

## 2013-12-07 DIAGNOSIS — M47817 Spondylosis without myelopathy or radiculopathy, lumbosacral region: Secondary | ICD-10-CM | POA: Insufficient documentation

## 2013-12-07 DIAGNOSIS — M25559 Pain in unspecified hip: Secondary | ICD-10-CM

## 2013-12-07 DIAGNOSIS — J4489 Other specified chronic obstructive pulmonary disease: Secondary | ICD-10-CM | POA: Insufficient documentation

## 2013-12-07 DIAGNOSIS — Z87891 Personal history of nicotine dependence: Secondary | ICD-10-CM | POA: Insufficient documentation

## 2013-12-07 DIAGNOSIS — I252 Old myocardial infarction: Secondary | ICD-10-CM | POA: Insufficient documentation

## 2013-12-07 DIAGNOSIS — I10 Essential (primary) hypertension: Secondary | ICD-10-CM | POA: Insufficient documentation

## 2013-12-07 DIAGNOSIS — J449 Chronic obstructive pulmonary disease, unspecified: Secondary | ICD-10-CM | POA: Insufficient documentation

## 2013-12-07 DIAGNOSIS — G8929 Other chronic pain: Secondary | ICD-10-CM

## 2013-12-07 DIAGNOSIS — M48061 Spinal stenosis, lumbar region without neurogenic claudication: Secondary | ICD-10-CM

## 2013-12-07 NOTE — Progress Notes (Signed)
Here for pill count and medication refills. Hydrocodone 5-325 #90 Fill date  11/10/13 # 90  Today NV# 19.5   VSS    Pain level:3  No changes in medications or pain level.  Opioid risk score is 0.  She is a little down about not being able to drive right now and getting re certified is around $300  (after having her stroke).  Follow up in one month with RN.  Refill given for  Hydrocodone.

## 2013-12-07 NOTE — Patient Instructions (Signed)
Follow up in one month with RN for med refill 

## 2014-01-01 ENCOUNTER — Other Ambulatory Visit: Payer: Self-pay | Admitting: *Deleted

## 2014-01-01 MED ORDER — HYDROCODONE-ACETAMINOPHEN 5-325 MG PO TABS: 1.0000 | ORAL_TABLET | Freq: Three times a day (TID) | ORAL | Status: DC | PRN

## 2014-01-01 NOTE — Telephone Encounter (Signed)
RX printed early for controlled medication for the visit with RN on 01/04/14 (to be signed by MD) 

## 2014-01-04 ENCOUNTER — Encounter: Payer: Self-pay | Admitting: *Deleted

## 2014-01-04 ENCOUNTER — Encounter: Payer: Medicare Other | Attending: Physical Medicine & Rehabilitation | Admitting: *Deleted

## 2014-01-04 VITALS — BP 123/68 | HR 67 | Resp 14 | Wt 176.4 lb

## 2014-01-04 DIAGNOSIS — J449 Chronic obstructive pulmonary disease, unspecified: Secondary | ICD-10-CM | POA: Insufficient documentation

## 2014-01-04 DIAGNOSIS — Z7901 Long term (current) use of anticoagulants: Secondary | ICD-10-CM | POA: Insufficient documentation

## 2014-01-04 DIAGNOSIS — M25559 Pain in unspecified hip: Secondary | ICD-10-CM

## 2014-01-04 DIAGNOSIS — F17200 Nicotine dependence, unspecified, uncomplicated: Secondary | ICD-10-CM | POA: Insufficient documentation

## 2014-01-04 DIAGNOSIS — M48061 Spinal stenosis, lumbar region without neurogenic claudication: Secondary | ICD-10-CM

## 2014-01-04 DIAGNOSIS — G8929 Other chronic pain: Secondary | ICD-10-CM

## 2014-01-04 DIAGNOSIS — J4489 Other specified chronic obstructive pulmonary disease: Secondary | ICD-10-CM | POA: Insufficient documentation

## 2014-01-04 DIAGNOSIS — D6859 Other primary thrombophilia: Secondary | ICD-10-CM | POA: Insufficient documentation

## 2014-01-04 DIAGNOSIS — I10 Essential (primary) hypertension: Secondary | ICD-10-CM | POA: Insufficient documentation

## 2014-01-04 DIAGNOSIS — I69920 Aphasia following unspecified cerebrovascular disease: Secondary | ICD-10-CM | POA: Insufficient documentation

## 2014-01-04 DIAGNOSIS — I252 Old myocardial infarction: Secondary | ICD-10-CM | POA: Insufficient documentation

## 2014-01-04 DIAGNOSIS — Z7902 Long term (current) use of antithrombotics/antiplatelets: Secondary | ICD-10-CM | POA: Insufficient documentation

## 2014-01-04 NOTE — Progress Notes (Signed)
Here for pill count and medication refills. Hydrocodone 5/325 #90 Fill date  12/13/13  Today NV#  15 VSS    Pain level:3  No falls.  Refill given for hydrocodone 5/325 #90.  Fall prevention in home handout given.  Return for med refill next month and 2 months with Kirsteins.

## 2014-01-04 NOTE — Patient Instructions (Addendum)
Follow up med refill in one month, two months with Kirsteins

## 2014-01-29 ENCOUNTER — Other Ambulatory Visit: Payer: Self-pay | Admitting: *Deleted

## 2014-01-29 MED ORDER — HYDROCODONE-ACETAMINOPHEN 5-325 MG PO TABS: 1.0000 | ORAL_TABLET | Freq: Three times a day (TID) | ORAL | Status: DC | PRN

## 2014-01-29 NOTE — Telephone Encounter (Signed)
RX printed for MD to sign for RN visit 02/01/14 

## 2014-02-01 ENCOUNTER — Encounter: Payer: Medicare Other | Attending: Physical Medicine & Rehabilitation | Admitting: *Deleted

## 2014-02-01 ENCOUNTER — Encounter: Payer: Self-pay | Admitting: *Deleted

## 2014-02-01 VITALS — BP 133/45 | HR 66 | Resp 14

## 2014-02-01 DIAGNOSIS — Z7901 Long term (current) use of anticoagulants: Secondary | ICD-10-CM | POA: Insufficient documentation

## 2014-02-01 DIAGNOSIS — J4489 Other specified chronic obstructive pulmonary disease: Secondary | ICD-10-CM | POA: Insufficient documentation

## 2014-02-01 DIAGNOSIS — G8929 Other chronic pain: Secondary | ICD-10-CM

## 2014-02-01 DIAGNOSIS — M48061 Spinal stenosis, lumbar region without neurogenic claudication: Secondary | ICD-10-CM | POA: Insufficient documentation

## 2014-02-01 DIAGNOSIS — D6859 Other primary thrombophilia: Secondary | ICD-10-CM | POA: Insufficient documentation

## 2014-02-01 DIAGNOSIS — I10 Essential (primary) hypertension: Secondary | ICD-10-CM | POA: Insufficient documentation

## 2014-02-01 DIAGNOSIS — F17200 Nicotine dependence, unspecified, uncomplicated: Secondary | ICD-10-CM | POA: Insufficient documentation

## 2014-02-01 DIAGNOSIS — I252 Old myocardial infarction: Secondary | ICD-10-CM | POA: Insufficient documentation

## 2014-02-01 DIAGNOSIS — I69920 Aphasia following unspecified cerebrovascular disease: Secondary | ICD-10-CM | POA: Insufficient documentation

## 2014-02-01 DIAGNOSIS — M79609 Pain in unspecified limb: Secondary | ICD-10-CM

## 2014-02-01 DIAGNOSIS — M25559 Pain in unspecified hip: Secondary | ICD-10-CM

## 2014-02-01 DIAGNOSIS — J449 Chronic obstructive pulmonary disease, unspecified: Secondary | ICD-10-CM | POA: Insufficient documentation

## 2014-02-01 NOTE — Progress Notes (Signed)
Here for pill count and medication refills. Hydrocodone 5/325 # 90 Fill date 01/09/14     Today NV# 22.5  Pill count appropriate.   VSS   [name] had a cardiac cath last Monday and she was found to have no blockages in any of her vessels.  They went in through her right wrist and she said her hand has been cold since and the fingers white. Her fingers are white but they do not presently feel cold. She said it has been getting gradually better.  She will return to see Dr Rodman PickleKristeins next month .  Refill given on her hydrocodone.

## 2014-02-01 NOTE — Patient Instructions (Signed)
Follow up with Dr Wynn BankerKirsteins in one month

## 2014-03-01 ENCOUNTER — Other Ambulatory Visit: Payer: Self-pay | Admitting: *Deleted

## 2014-03-01 MED ORDER — HYDROCODONE-ACETAMINOPHEN 5-325 MG PO TABS: 1.0000 | ORAL_TABLET | Freq: Three times a day (TID) | ORAL | Status: DC | PRN

## 2014-03-01 NOTE — Telephone Encounter (Signed)
[facility] rx printed for MD to sign for RN med refill appt.

## 2014-03-03 ENCOUNTER — Encounter: Payer: Medicare Other | Attending: Physical Medicine & Rehabilitation | Admitting: *Deleted

## 2014-03-03 ENCOUNTER — Encounter: Payer: Self-pay | Admitting: *Deleted

## 2014-03-03 VITALS — BP 152/72 | HR 67 | Resp 14

## 2014-03-03 DIAGNOSIS — Z79899 Other long term (current) drug therapy: Secondary | ICD-10-CM | POA: Insufficient documentation

## 2014-03-03 DIAGNOSIS — M48062 Spinal stenosis, lumbar region with neurogenic claudication: Secondary | ICD-10-CM

## 2014-03-03 DIAGNOSIS — J4489 Other specified chronic obstructive pulmonary disease: Secondary | ICD-10-CM | POA: Insufficient documentation

## 2014-03-03 DIAGNOSIS — I252 Old myocardial infarction: Secondary | ICD-10-CM | POA: Insufficient documentation

## 2014-03-03 DIAGNOSIS — M47812 Spondylosis without myelopathy or radiculopathy, cervical region: Secondary | ICD-10-CM | POA: Insufficient documentation

## 2014-03-03 DIAGNOSIS — Z7901 Long term (current) use of anticoagulants: Secondary | ICD-10-CM | POA: Insufficient documentation

## 2014-03-03 DIAGNOSIS — M47817 Spondylosis without myelopathy or radiculopathy, lumbosacral region: Secondary | ICD-10-CM | POA: Insufficient documentation

## 2014-03-03 DIAGNOSIS — I69920 Aphasia following unspecified cerebrovascular disease: Secondary | ICD-10-CM | POA: Insufficient documentation

## 2014-03-03 DIAGNOSIS — J449 Chronic obstructive pulmonary disease, unspecified: Secondary | ICD-10-CM | POA: Insufficient documentation

## 2014-03-03 DIAGNOSIS — I10 Essential (primary) hypertension: Secondary | ICD-10-CM | POA: Insufficient documentation

## 2014-03-03 DIAGNOSIS — Z87891 Personal history of nicotine dependence: Secondary | ICD-10-CM | POA: Insufficient documentation

## 2014-03-03 DIAGNOSIS — M48061 Spinal stenosis, lumbar region without neurogenic claudication: Secondary | ICD-10-CM

## 2014-03-03 NOTE — Progress Notes (Signed)
Here for pill count and medication refills. Hydrocodone 5/325 #90 Fill date  02/08/14  Today NV#19 appropriate   VSS    Refill given.  She has had no falls.  Return to see Dr Wynn BankerKirsteins next month.

## 2014-03-03 NOTE — Patient Instructions (Signed)
Follow up next month with Dr Kirsteins 

## 2014-03-04 ENCOUNTER — Ambulatory Visit: Payer: Medicare Other | Admitting: Physical Medicine & Rehabilitation

## 2014-04-05 ENCOUNTER — Encounter: Payer: Self-pay | Admitting: Physical Medicine & Rehabilitation

## 2014-04-05 ENCOUNTER — Encounter: Payer: Medicare Other | Attending: Physical Medicine & Rehabilitation

## 2014-04-05 ENCOUNTER — Ambulatory Visit (HOSPITAL_BASED_OUTPATIENT_CLINIC_OR_DEPARTMENT_OTHER): Payer: Medicare Other | Admitting: Physical Medicine & Rehabilitation

## 2014-04-05 VITALS — BP 153/80 | HR 74 | Resp 14 | Wt 171.4 lb

## 2014-04-05 DIAGNOSIS — I252 Old myocardial infarction: Secondary | ICD-10-CM | POA: Insufficient documentation

## 2014-04-05 DIAGNOSIS — M353 Polymyalgia rheumatica: Secondary | ICD-10-CM | POA: Insufficient documentation

## 2014-04-05 DIAGNOSIS — Z7901 Long term (current) use of anticoagulants: Secondary | ICD-10-CM | POA: Insufficient documentation

## 2014-04-05 DIAGNOSIS — I10 Essential (primary) hypertension: Secondary | ICD-10-CM | POA: Insufficient documentation

## 2014-04-05 DIAGNOSIS — Z87891 Personal history of nicotine dependence: Secondary | ICD-10-CM | POA: Insufficient documentation

## 2014-04-05 DIAGNOSIS — M48061 Spinal stenosis, lumbar region without neurogenic claudication: Secondary | ICD-10-CM

## 2014-04-05 DIAGNOSIS — M47812 Spondylosis without myelopathy or radiculopathy, cervical region: Secondary | ICD-10-CM | POA: Insufficient documentation

## 2014-04-05 DIAGNOSIS — IMO0001 Reserved for inherently not codable concepts without codable children: Secondary | ICD-10-CM

## 2014-04-05 DIAGNOSIS — I69920 Aphasia following unspecified cerebrovascular disease: Secondary | ICD-10-CM | POA: Insufficient documentation

## 2014-04-05 DIAGNOSIS — M47817 Spondylosis without myelopathy or radiculopathy, lumbosacral region: Secondary | ICD-10-CM | POA: Insufficient documentation

## 2014-04-05 DIAGNOSIS — Z79899 Other long term (current) drug therapy: Secondary | ICD-10-CM | POA: Insufficient documentation

## 2014-04-05 DIAGNOSIS — J449 Chronic obstructive pulmonary disease, unspecified: Secondary | ICD-10-CM | POA: Insufficient documentation

## 2014-04-05 DIAGNOSIS — J4489 Other specified chronic obstructive pulmonary disease: Secondary | ICD-10-CM | POA: Insufficient documentation

## 2014-04-05 MED ORDER — HYDROCODONE-ACETAMINOPHEN 5-325 MG PO TABS: 1.0000 | ORAL_TABLET | Freq: Three times a day (TID) | ORAL | Status: DC | PRN

## 2014-04-05 MED ORDER — VENLAFAXINE HCL ER 37.5 MG PO CP24: 37.5000 mg | ORAL_CAPSULE | Freq: Every day | ORAL | Status: DC

## 2014-04-05 NOTE — Progress Notes (Signed)
Subjective:    Patient ID: [name], female    DOB: 1946-03-04, 68 y.o.   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 Has had expressive aphasia. Bilateral upper outer quadrant field loss.Improved per neuro No balance issues.   HPI  Muscle pains related to crestor, improved after reduction of dose  Has PMR some flare up has appt with rheumatologist  Accompanied by her daughter today. Concerns regarding increasing all over body pain. Has been evaluated in the past by rheumatologist to do as well as a local dermatologist. Diagnosed with polymyalgia rheumatica. The patient does state she has shoulder and hip pain but in addition has all over back pain  As well as limb pain more distally  No longer seeing neurology on a regular basis Pain Inventory Average Pain 7 Pain Right Now 8 My pain is constant, dull, tingling and aching  In the last 24 hours, has pain interfered with the following? General activity 10 Relation with others 3 Enjoyment of life 8 What TIME of day is your pain at its worst? all Sleep (in general) Fair  Pain is worse with: walking, bending, sitting, standing and some activites Pain improves with: rest and medication Relief from Meds: 7  Mobility walk without assistance how many minutes can you walk? 30  Function retired I need assistance with the following:  meal prep, household duties and shopping  Neuro/Psych bladder control problems weakness numbness tingling spasms anxiety  Prior Studies Any changes since last visit?  no  Physicians involved in your care Any changes since last visit?  no   Family History  Problem Relation Age of Onset  . Heart disease Father   . Factor V Leiden deficiency Father   . Factor V Leiden deficiency Brother   . Factor V Leiden deficiency Daughter    History   Social History  . Marital Status: Married    Spouse Name: N/A    Number of Children: N/A  . Years of Education: N/A   Social  History Main Topics  . Smoking status: Current Some Day Smoker  . Smokeless tobacco: Never Used     Comment: Smokes e cigarettes  . Alcohol Use: No  . Drug Use: None  . Sexual Activity: None   Other Topics Concern  . None   Social History Narrative  . None   Past Surgical History  Procedure Laterality Date  . Cataract extraction    . Cholecystectomy    . Abdominal hysterectomy    . Breast surgery     Past Medical History  Diagnosis Date  . Hypertension   . Arthritis   . Cataract   . COPD (chronic obstructive pulmonary disease)   . Factor 5 Leiden mutation, heterozygous   . Myocardial infarction    BP 153/80  Pulse 74  Resp 14  Wt 171 lb 6.4 oz (77.747 kg)  SpO2 91%  Opioid Risk Score:   Fall Risk Score: Moderate Fall Risk (6-13 points) (educated and handout given for fall prevention in the home at previous visit)  Review of Systems  Constitutional: Positive for unexpected weight change.  Gastrointestinal: Positive for constipation.  Musculoskeletal: Positive for back pain.  Neurological: Positive for weakness and numbness.       Tingling  Psychiatric/Behavioral: The patient is nervous/anxious.   All other systems reviewed and are negative.      Objective:   Physical Exam  16-18 fibromyalgia points positive Straight leg raising test is negative Motor strength is 5/5  bilateral deltoid, bicep, tricep, grip, hip flexion, knee extensors, ankle dorsi plantar flexion Lumbar range of motion 50% forward flexion extension and lateral bending  Visual fields intact to confrontation       Assessment & Plan:  1. History of lumbar spinal stenosis deficits are stable. Continue hydrocodone 3 times per day. Cautioned about the increased incidence of side effects once patient has had a CVA   2. Recent left occipital infarct, history of factor V 5 Leiden deficiency on chronic Coumadin, now switched from aspirin to Plavix, will be starting physical therapy for this as  well as OT and speech  3.  Fibromyalgia syndrome trial of  Effexor XR 37.5 mg 1 per day The patient did not tolerate amitriptyline, she did not tolerate gabapentin, she cannot afford Lyrica  4.  Polymyalgia rheumatica followup with rheumatology  5. Chronic pain syndrome with discussed the numerous factors that may relate to the patient's pain with both the patient and her daughter who is a Engineer, civil (consulting)nurse. We discussed various medication possibilities as well as risks and benefits of various approaches. Over half of the 25 min visit was spent counseling and coordinating care.

## 2014-05-04 ENCOUNTER — Encounter: Payer: Self-pay | Admitting: Registered Nurse

## 2014-05-04 ENCOUNTER — Encounter: Payer: Medicare Other | Attending: Physical Medicine & Rehabilitation | Admitting: Registered Nurse

## 2014-05-04 ENCOUNTER — Encounter: Payer: Medicare Other | Admitting: Registered Nurse

## 2014-05-04 VITALS — BP 143/47 | HR 68 | Resp 14 | Ht 65.0 in | Wt 171.0 lb

## 2014-05-04 DIAGNOSIS — N289 Disorder of kidney and ureter, unspecified: Secondary | ICD-10-CM

## 2014-05-04 DIAGNOSIS — M353 Polymyalgia rheumatica: Secondary | ICD-10-CM

## 2014-05-04 DIAGNOSIS — Z5181 Encounter for therapeutic drug level monitoring: Secondary | ICD-10-CM

## 2014-05-04 DIAGNOSIS — M48061 Spinal stenosis, lumbar region without neurogenic claudication: Secondary | ICD-10-CM

## 2014-05-04 DIAGNOSIS — Z79899 Other long term (current) drug therapy: Secondary | ICD-10-CM

## 2014-05-04 DIAGNOSIS — IMO0001 Reserved for inherently not codable concepts without codable children: Secondary | ICD-10-CM

## 2014-05-04 MED ORDER — HYDROCODONE-ACETAMINOPHEN 5-325 MG PO TABS: 1.0000 | ORAL_TABLET | Freq: Three times a day (TID) | ORAL | Status: DC | PRN

## 2014-05-04 NOTE — Progress Notes (Signed)
Subjective:    Patient ID: [name], female    DOB: Jun 21, 1946, 68 y.o.   MRN: [id number]  HPI: [name] is a 68 year old female who returns for follow up for chronic pain and medication refill.She says her pain is located in her neck, back and bilateral hips. She rates her pain 6. Her current exercise regime is performing stretching exercises on her neck. Has been encouraged to start pool therapy and increase activity level. She verbalizes understanding.  Daughter in room all questions answered. She has discontinued the Effexor XR 37.5 due to somnolence. It was helping her pain. Will discuss with Dr. Wynn BankerKirsteins to see if Paxil 10 mg hs would be beneficial.    Pain Inventory Average Pain 6 Pain Right Now 6 My pain is dull, tingling and aching  In the last 24 hours, has pain interfered with the following? General activity 6 Relation with others 4 Enjoyment of life 6 What TIME of day is your pain at its worst? morning and evening Sleep (in general) Fair  Pain is worse with: walking, bending, sitting and standing Pain improves with: heat/ice, medication, TENS and injections Relief from Meds: 7  Mobility walk without assistance how many minutes can you walk? 30 ability to climb steps?  no do you drive?  yes transfers alone Do you have any goals in this area?  yes  Function retired I need assistance with the following:  meal prep, household duties and shopping  Neuro/Psych weakness numbness tingling trouble walking spasms anxiety  Prior Studies Any changes since last visit?  no  Physicians involved in your care Any changes since last visit?  no   Family History  Problem Relation Age of Onset  . Heart disease Father   . Factor V Leiden deficiency Father   . Factor V Leiden deficiency Brother   . Factor V Leiden deficiency Daughter    History   Social History  . Marital Status: Married    Spouse Name: N/A    Number of Children: N/A  .  Years of Education: N/A   Social History Main Topics  . Smoking status: Current Some Day Smoker  . Smokeless tobacco: Never Used     Comment: Smokes e cigarettes  . Alcohol Use: No  . Drug Use: None  . Sexual Activity: None   Other Topics Concern  . None   Social History Narrative  . None   Past Surgical History  Procedure Laterality Date  . Cataract extraction    . Cholecystectomy    . Abdominal hysterectomy    . Breast surgery     Past Medical History  Diagnosis Date  . Hypertension   . Arthritis   . Cataract   . COPD (chronic obstructive pulmonary disease)   . Factor 5 Leiden mutation, heterozygous   . Myocardial infarction    BP 143/47  Pulse 68  Resp 14  Ht 5\' 5"  (1.651 m)  Wt 171 lb (77.565 kg)  BMI 28.46 kg/m2  SpO2 93%  Opioid Risk Score:   Fall Risk Score: Moderate Fall Risk (6-13 points) (patient educated handout declined)   Review of Systems  Respiratory: Positive for shortness of breath and wheezing.   Gastrointestinal: Positive for constipation.  Musculoskeletal: Positive for gait problem.  Neurological: Positive for weakness and numbness.       Tingling, spasm  Psychiatric/Behavioral: The patient is nervous/anxious.   All other systems reviewed and are negative.      Objective:  Physical Exam  Nursing note and vitals reviewed. Constitutional: She is oriented to person, place, and time. She appears well-developed and well-nourished.  HENT:  Head: Normocephalic and atraumatic.  Neck: Normal range of motion. Neck supple.  Cervical Paraspinal Tenderness: C-3- C-5  Cardiovascular: Normal rate, regular rhythm and normal heart sounds.   Pulmonary/Chest: Effort normal and breath sounds normal.  Musculoskeletal:  Normal Muscle Bulk and Muscle testing reveals: Upper extremities: Full ROM and Muscle Strength 5/5 Back without spinal or paraspinal tenderness Lower Extremities: Full ROM and Muscle Strength 5/5 Arises from chair with ease. Narrow  Based Gait   Neurological: She is alert and oriented to person, place, and time.  Skin: Skin is warm and dry.  Psychiatric: She has a normal mood and affect.          Assessment & Plan:  1. History of lumbar spinal stenosis: Refilled: Hydrocodone 5/325mg  one tablet every 8 hours #95 2. Recent left occipital infarct, history of factor V 5 Leiden deficiency: On Plavix. Continue to monitor 3. Fibromyalgia syndrome: She discontinued Effexor due to Somnolence. Will speak to Dr. Wynn BankerKirsteins Regarding Paxil. 4. Polymyalgia rheumatica followup with rheumatology  5. Chronic pain syndrome: Encouraged increase activity, heat, ice and pool therapy.  20 minutes of face to face patient care time was spent during this visit. All questions were encouraged and answered.  F/U in 1 month

## 2014-05-05 LAB — BASIC METABOLIC PANEL WITH GFR
BUN: 21 mg/dL (ref 6–23)
CHLORIDE: 101 meq/L (ref 96–112)
CO2: 26 meq/L (ref 19–32)
CREATININE: 1.49 mg/dL — AB (ref 0.50–1.10)
Calcium: 8.9 mg/dL (ref 8.4–10.5)
GFR, Est African American: 41 mL/min — ABNORMAL LOW
GFR, Est Non African American: 36 mL/min — ABNORMAL LOW
GLUCOSE: 47 mg/dL — AB (ref 70–99)
Potassium: 4.5 mEq/L (ref 3.5–5.3)
Sodium: 140 mEq/L (ref 135–145)

## 2014-06-02 ENCOUNTER — Encounter: Payer: Medicare Other | Attending: Physical Medicine & Rehabilitation | Admitting: Registered Nurse

## 2014-06-02 ENCOUNTER — Other Ambulatory Visit: Payer: Self-pay

## 2014-06-02 ENCOUNTER — Encounter: Payer: Self-pay | Admitting: Registered Nurse

## 2014-06-02 VITALS — BP 153/60 | HR 76 | Resp 14 | Ht 63.0 in | Wt 167.0 lb

## 2014-06-02 DIAGNOSIS — N289 Disorder of kidney and ureter, unspecified: Secondary | ICD-10-CM

## 2014-06-02 DIAGNOSIS — Z5181 Encounter for therapeutic drug level monitoring: Secondary | ICD-10-CM

## 2014-06-02 DIAGNOSIS — M48061 Spinal stenosis, lumbar region without neurogenic claudication: Secondary | ICD-10-CM

## 2014-06-02 DIAGNOSIS — Z79899 Other long term (current) drug therapy: Secondary | ICD-10-CM

## 2014-06-02 DIAGNOSIS — M353 Polymyalgia rheumatica: Secondary | ICD-10-CM

## 2014-06-02 DIAGNOSIS — IMO0001 Reserved for inherently not codable concepts without codable children: Secondary | ICD-10-CM

## 2014-06-02 MED ORDER — HYDROCODONE-ACETAMINOPHEN 5-325 MG PO TABS: 1.0000 | ORAL_TABLET | Freq: Three times a day (TID) | ORAL | Status: DC | PRN

## 2014-06-02 NOTE — Progress Notes (Signed)
Subjective:    Patient ID: [name], female    DOB: 1946-05-31, 68 y.o.   MRN: [id number]  HPI: [name] is a 68 year old female who returns for follow up for chronic pain and medication refill. She says her pain is located in her lower back and right leg. She rates her pain 7. She only walks around the house. She has been encouraged to increase her activity. She has been demonstrated chair exercises and encouraged to start. She verbalized understanding. Her daughter in the room and states she as been encouraging her mother also. She has purchased her light weights as well. [name] says she will start using them.  Pain Inventory Average Pain 6 Pain Right Now 7 My pain is constant, dull, tingling and aching  In the last 24 hours, has pain interfered with the following? General activity 6 Relation with others 3 Enjoyment of life 5 What TIME of day is your pain at its worst? morning, evening Sleep (in general) Fair  Pain is worse with: walking, bending and standing Pain improves with: heat/ice and medication Relief from Meds: 7  Mobility walk without assistance ability to climb steps?  yes do you drive?  yes transfers alone Do you have any goals in this area?  yes  Function I need assistance with the following:  household duties and shopping  Neuro/Psych bladder control problems weakness numbness tingling spasms anxiety  Prior Studies Any changes since last visit?  no  Physicians involved in your care Any changes since last visit?  no   Family History  Problem Relation Age of Onset  . Heart disease Father   . Factor V Leiden deficiency Father   . Factor V Leiden deficiency Brother   . Factor V Leiden deficiency Daughter    History   Social History  . Marital Status: Married    Spouse Name: N/A    Number of Children: N/A  . Years of Education: N/A   Social History Main Topics  . Smoking status: Current Some Day Smoker  . Smokeless  tobacco: Never Used     Comment: Smokes e cigarettes  . Alcohol Use: No  . Drug Use: None  . Sexual Activity: None   Other Topics Concern  . None   Social History Narrative  . None   Past Surgical History  Procedure Laterality Date  . Cataract extraction    . Cholecystectomy    . Abdominal hysterectomy    . Breast surgery     Past Medical History  Diagnosis Date  . Hypertension   . Arthritis   . Cataract   . COPD (chronic obstructive pulmonary disease)   . Factor 5 Leiden mutation, heterozygous   . Myocardial infarction    BP 153/60  Pulse 76  Resp 14  Ht 5\' 3"  (1.6 m)  Wt 167 lb (75.751 kg)  BMI 29.59 kg/m2  SpO2 92%  Opioid Risk Score:   Fall Risk Score: Moderate Fall Risk (6-13 points) (pt educated on fall risk, brochure given to pt previously)   Review of Systems  Respiratory: Positive for shortness of breath.   Cardiovascular: Positive for leg swelling.  Gastrointestinal: Positive for constipation.  Genitourinary:       Bladder control problems  Musculoskeletal: Positive for back pain.  Neurological: Positive for weakness and numbness.       Tingling, spasms  Hematological: Bruises/bleeds easily.  Psychiatric/Behavioral: The patient is nervous/anxious.   All other systems reviewed and are negative.  Objective:   Physical Exam  Nursing note and vitals reviewed. Constitutional: She is oriented to person, place, and time. She appears well-developed and well-nourished.  HENT:  Head: Normocephalic and atraumatic.  Neck: Neck supple.  Cardiovascular: Normal rate and regular rhythm.   Pulmonary/Chest: Effort normal and breath sounds normal.  Musculoskeletal:  Normal Muscle Bulk and Muscle Testing Reveals: Upper Extremities: Full ROM and Muscle strength 5/5 Lumbar Paraspinal Tenderness: L-3- L-5 Lower Extremities: Full ROM and Muscle strength 5/5 Arises from chair with ease Narrow Based Gait  Neurological: She is alert and oriented to person,  place, and time.  Skin: Skin is warm and dry.  Psychiatric: She has a normal mood and affect.          Assessment & Plan:  1. History of lumbar spinal stenosis:  Refilled: Hydrocodone 5/325mg  one tablet every 8 hours #95  2. Recent left occipital infarct, history of factor V 5 Leiden deficiency: On Plavix. Continue to monitor  3. Fibromyalgia syndrome: Encouraged increased activity. 4. Polymyalgia rheumatica followup with rheumatology  5. Chronic pain syndrome: Encouraged increase activity, heat, ice and pool therapy.   20 minutes of face to face patient care time was spent during this visit. All questions were encouraged and answered.   F/U in 1 month

## 2014-06-07 ENCOUNTER — Telehealth: Payer: Self-pay | Admitting: Registered Nurse

## 2014-06-07 MED ORDER — VENLAFAXINE HCL 25 MG PO TABS: 25.0000 mg | ORAL_TABLET | Freq: Every day | ORAL | Status: DC

## 2014-06-07 NOTE — Telephone Encounter (Signed)
I spoke with Dr. Wynn BankerKirsteins,  We will prescribed a low dose effexor 25 mg daily. The extended Released caused somnolence.  Patient denies any allergies. Script sent to the pharmacy. Mrs. Warnke verbalizes understanding.

## 2014-07-05 ENCOUNTER — Encounter: Payer: Medicare Other | Attending: Physical Medicine & Rehabilitation | Admitting: Registered Nurse

## 2014-07-05 ENCOUNTER — Encounter: Payer: Self-pay | Admitting: Registered Nurse

## 2014-07-05 VITALS — BP 151/72 | HR 62 | Resp 14 | Ht 65.0 in | Wt 166.0 lb

## 2014-07-05 DIAGNOSIS — M48061 Spinal stenosis, lumbar region without neurogenic claudication: Secondary | ICD-10-CM

## 2014-07-05 DIAGNOSIS — N289 Disorder of kidney and ureter, unspecified: Secondary | ICD-10-CM | POA: Diagnosis present

## 2014-07-05 DIAGNOSIS — Z79899 Other long term (current) drug therapy: Secondary | ICD-10-CM

## 2014-07-05 DIAGNOSIS — IMO0001 Reserved for inherently not codable concepts without codable children: Secondary | ICD-10-CM

## 2014-07-05 DIAGNOSIS — M353 Polymyalgia rheumatica: Secondary | ICD-10-CM

## 2014-07-05 DIAGNOSIS — Z5181 Encounter for therapeutic drug level monitoring: Secondary | ICD-10-CM

## 2014-07-05 MED ORDER — HYDROCODONE-ACETAMINOPHEN 5-325 MG PO TABS: 1.0000 | ORAL_TABLET | Freq: Three times a day (TID) | ORAL | Status: DC | PRN

## 2014-07-05 NOTE — Progress Notes (Signed)
Subjective:    Patient ID: [name], female    DOB: [birth year], 68 y.o.   MRN: [id number]  HPI: [name] is a 68 year old female who returns for follow up for chronic pain and medication refill. She says her pain is located in her toes 2nd and third digits bilaterally.. She rates her pain 6. Her current exercise regime is gardening and yard work.  Daughter in room all questions answered.   Pain Inventory Average Pain 6 Pain Right Now 6 My pain is constant, dull, tingling and aching  In the last 24 hours, has pain interfered with the following? General activity 6 Relation with others 4 Enjoyment of life 6 What TIME of day is your pain at its worst? morning, evening Sleep (in general) Fair  Pain is worse with: walking, bending and standing Pain improves with: heat/ice and medication Relief from Meds: 7  Mobility walk without assistance ability to climb steps?  yes do you drive?  yes transfers alone Do you have any goals in this area?  yes  Function I need assistance with the following:  household duties and shopping Do you have any goals in this area?  yes  Neuro/Psych bladder control problems weakness numbness tingling spasms anxiety  Prior Studies Any changes since last visit?  no  Physicians involved in your care Any changes since last visit?  no   Family History  Problem Relation Age of Onset  . Heart disease Father   . Factor V Leiden deficiency Father   . Factor V Leiden deficiency Brother   . Factor V Leiden deficiency Daughter    History   Social History  . Marital Status: Married    Spouse Name: N/A    Number of Children: N/A  . Years of Education: N/A   Social History Main Topics  . Smoking status: Current Some Day Smoker  . Smokeless tobacco: Never Used     Comment: Smokes e cigarettes  . Alcohol Use: No  . Drug Use: None  . Sexual Activity: None   Other Topics Concern  . None   Social History Narrative  . None     Past Surgical History  Procedure Laterality Date  . Cataract extraction    . Cholecystectomy    . Abdominal hysterectomy    . Breast surgery     Past Medical History  Diagnosis Date  . Hypertension   . Arthritis   . Cataract   . COPD (chronic obstructive pulmonary disease)   . Factor 5 Leiden mutation, heterozygous   . Myocardial infarction    BP 151/72  Pulse 62  Resp 14  Ht 5\' 5"  (1.651 m)  Wt 166 lb (75.297 kg)  BMI 27.62 kg/m2  SpO2 93%  Opioid Risk Score:   Fall Risk Score: Moderate Fall Risk (6-13 points) (pt educated, declined brochure)    Review of Systems  Respiratory: Positive for shortness of breath.   Cardiovascular: Positive for leg swelling.  Gastrointestinal: Positive for constipation.  Genitourinary:       Bladder control problems  Neurological: Positive for weakness and numbness.       Tingling, spasms  Hematological: Bruises/bleeds easily.  Psychiatric/Behavioral: The patient is nervous/anxious.   All other systems reviewed and are negative.      Objective:   Physical Exam  Nursing note and vitals reviewed. Constitutional: She is oriented to person, place, and time. She appears well-developed and well-nourished.  HENT:  Head: Normocephalic and atraumatic.  Neck:  Normal range of motion. Neck supple.  Cardiovascular: Normal rate and regular rhythm.   Pulmonary/Chest: Effort normal and breath sounds normal.  Musculoskeletal:  Normal Muscle Bulk and Muscle Testing Reveals: Upper Extremities: Full ROM and Muscle Strength 5/5 Back without spinal or paraspinal tenderness, Lower Extremities: Full ROM and Muscle Strength 5/5 Arises from chair with ease Narrow Based Gait  Neurological: She is alert and oriented to person, place, and time.  Skin: Skin is warm and dry.  Psychiatric: She has a normal mood and affect.          Assessment & Plan:  1. History of lumbar spinal stenosis:  Refilled: Hydrocodone 5/325mg  one tablet every 8 hours  #95  2. Recent left occipital infarct, history of factor V 5 Leiden deficiency: On Plavix. Continue to monitor  3. Fibromyalgia syndrome:  Effexor e-scribed, [name] not sure if she has medication. Pharmacy was called . Medication was picked up 06/10/14. Daughter will call the office if she finds the medication. She has been encouraged to continue to increased activity.  4. Polymyalgia rheumatica followup with rheumatology  5. Chronic pain syndrome: Encouraged increase activity, heat, ice and pool therapy.   20 minutes of face to face patient care time was spent during this visit. All questions were encouraged and answered.   F/U in 1 month

## 2014-08-06 ENCOUNTER — Ambulatory Visit: Payer: Medicare Other | Admitting: Physical Medicine & Rehabilitation

## 2014-08-06 DIAGNOSIS — N289 Disorder of kidney and ureter, unspecified: Secondary | ICD-10-CM | POA: Insufficient documentation

## 2014-08-11 ENCOUNTER — Encounter: Payer: Medicare Other | Attending: Physical Medicine & Rehabilitation | Admitting: Registered Nurse

## 2014-08-11 ENCOUNTER — Encounter: Payer: Self-pay | Admitting: Registered Nurse

## 2014-08-11 VITALS — BP 149/53 | HR 71 | Resp 14 | Ht 65.0 in | Wt 165.0 lb

## 2014-08-11 DIAGNOSIS — N289 Disorder of kidney and ureter, unspecified: Secondary | ICD-10-CM

## 2014-08-11 DIAGNOSIS — M353 Polymyalgia rheumatica: Secondary | ICD-10-CM

## 2014-08-11 DIAGNOSIS — Z5181 Encounter for therapeutic drug level monitoring: Secondary | ICD-10-CM

## 2014-08-11 DIAGNOSIS — Z79899 Other long term (current) drug therapy: Secondary | ICD-10-CM

## 2014-08-11 DIAGNOSIS — IMO0001 Reserved for inherently not codable concepts without codable children: Secondary | ICD-10-CM

## 2014-08-11 DIAGNOSIS — M48061 Spinal stenosis, lumbar region without neurogenic claudication: Secondary | ICD-10-CM

## 2014-08-11 MED ORDER — HYDROCODONE-ACETAMINOPHEN 5-325 MG PO TABS: 1.0000 | ORAL_TABLET | Freq: Three times a day (TID) | ORAL | Status: DC | PRN

## 2014-08-11 NOTE — Progress Notes (Signed)
Subjective:    Patient ID: [name], female    DOB: [birth year], 68 y.o.   MRN: [id number]  HPI: [name] is a 68 year old female who returns for follow up for chronic pain and medication refill. She says her pain is located in her neck. She rates her pain 6. Her current exercise regime is walking pulling up her flowers out of the garden at this time. She says her husband is in the hospital at [facility] for GI Bleed, emotional support given.  Pain Inventory Average Pain 6 Pain Right Now 6 My pain is constant, sharp and dull  In the last 24 hours, has pain interfered with the following? General activity 5 Relation with others 3 Enjoyment of life 5 What TIME of day is your pain at its worst? morning, evening, night Sleep (in general) Fair  Pain is worse with: walking, bending and standing Pain improves with: rest, heat/ice and medication Relief from Meds: 7  Mobility walk without assistance  Function retired  Neuro/Psych bladder control problems numbness tingling trouble walking spasms  Prior Studies Any changes since last visit?  no  Physicians involved in your care Any changes since last visit?  no   Family History  Problem Relation Age of Onset  . Heart disease Father   . Factor V Leiden deficiency Father   . Factor V Leiden deficiency Brother   . Factor V Leiden deficiency Daughter    History   Social History  . Marital Status: Married    Spouse Name: N/A    Number of Children: N/A  . Years of Education: N/A   Social History Main Topics  . Smoking status: Current Some Day Smoker  . Smokeless tobacco: Never Used     Comment: Smokes e cigarettes  . Alcohol Use: No  . Drug Use: None  . Sexual Activity: None   Other Topics Concern  . None   Social History Narrative  . None   Past Surgical History  Procedure Laterality Date  . Cataract extraction    . Cholecystectomy    . Abdominal hysterectomy    . Breast surgery      Past Medical History  Diagnosis Date  . Hypertension   . Arthritis   . Cataract   . COPD (chronic obstructive pulmonary disease)   . Factor 5 Leiden mutation, heterozygous   . Myocardial infarction    BP 149/53  Pulse 71  Resp 14  Ht  (1.651 m)  Wt 165 lb (74.844 kg)  BMI 27.46 kg/m2  SpO2 89%  Opioid Risk Score:   Fall Risk Score: Low Fall Risk (0-5 points)   Review of Systems     Objective:   Physical Exam  Nursing note and vitals reviewed. Constitutional: She is oriented to person, place, and time. She appears well-developed and well-nourished.  HENT:  Head: Normocephalic and atraumatic.  Neck: Normal range of motion. Neck supple.  Cervical Paraspinal Tenderness: C-3- C-5  Cardiovascular: Normal rate and regular rhythm.   Pulmonary/Chest: Effort normal and breath sounds normal.  Musculoskeletal:  Normal Muscle Bulk and Muscle testing Reveals: Upper Extremities: Full ROM and Muscle Strength 5/5 Back without Spinal or Paras(pinal Tenderness Lower Extremities: Full ROM and Muscle Strength 5/5 Arises from chair with ease Narrow based gait   Neurological: She is alert and oriented to person, place, and time.  Skin: Skin is warm and dry.  Psychiatric: She has a normal mood and affect.  Assessment & Plan:  1. History of lumbar spinal stenosis:  Refilled: Hydrocodone 5/325mg  one tablet every 8 hours #95  2. Recent left occipital infarct, history of factor V 5 Leiden deficiency: On Plavix. Continue to monitor  3. Fibromyalgia syndrome:  Continue with Effexor: She has been encouraged to continue to increase her activity.  4. Polymyalgia rheumatica followup with rheumatology  5. Chronic pain syndrome: Encouraged increase activity, heat, ice and pool therapy.  15 minutes of face to face patient care time was spent during this visit. All questions were encouraged and answered.   F/U in 1 month

## 2014-08-25 NOTE — Telephone Encounter (Signed)
Close encounter 

## 2014-09-10 ENCOUNTER — Ambulatory Visit: Payer: Medicare Other | Admitting: Physical Medicine & Rehabilitation

## 2014-09-13 ENCOUNTER — Encounter: Payer: Self-pay | Admitting: Registered Nurse

## 2014-09-13 ENCOUNTER — Encounter: Payer: Medicare Other | Attending: Physical Medicine & Rehabilitation | Admitting: Registered Nurse

## 2014-09-13 ENCOUNTER — Ambulatory Visit: Payer: Medicare Other | Admitting: Registered Nurse

## 2014-09-13 VITALS — BP 151/61 | HR 58 | Resp 14 | Ht 65.0 in | Wt 168.0 lb

## 2014-09-13 DIAGNOSIS — M4806 Spinal stenosis, lumbar region: Secondary | ICD-10-CM | POA: Diagnosis not present

## 2014-09-13 DIAGNOSIS — D682 Hereditary deficiency of other clotting factors: Secondary | ICD-10-CM | POA: Diagnosis not present

## 2014-09-13 DIAGNOSIS — N289 Disorder of kidney and ureter, unspecified: Secondary | ICD-10-CM

## 2014-09-13 DIAGNOSIS — Z5181 Encounter for therapeutic drug level monitoring: Secondary | ICD-10-CM

## 2014-09-13 DIAGNOSIS — G894 Chronic pain syndrome: Secondary | ICD-10-CM | POA: Insufficient documentation

## 2014-09-13 DIAGNOSIS — Z79899 Other long term (current) drug therapy: Secondary | ICD-10-CM

## 2014-09-13 DIAGNOSIS — Z8673 Personal history of transient ischemic attack (TIA), and cerebral infarction without residual deficits: Secondary | ICD-10-CM | POA: Diagnosis not present

## 2014-09-13 DIAGNOSIS — G8929 Other chronic pain: Secondary | ICD-10-CM | POA: Diagnosis present

## 2014-09-13 DIAGNOSIS — M353 Polymyalgia rheumatica: Secondary | ICD-10-CM | POA: Insufficient documentation

## 2014-09-13 DIAGNOSIS — M797 Fibromyalgia: Secondary | ICD-10-CM | POA: Diagnosis not present

## 2014-09-13 DIAGNOSIS — M48061 Spinal stenosis, lumbar region without neurogenic claudication: Secondary | ICD-10-CM

## 2014-09-13 MED ORDER — HYDROCODONE-ACETAMINOPHEN 5-325 MG PO TABS: 1.0000 | ORAL_TABLET | Freq: Three times a day (TID) | ORAL | Status: DC | PRN

## 2014-09-13 NOTE — Progress Notes (Signed)
Subjective:    Patient ID: Brandy EnsignDixie Suydam, female    DOB: 12/12/1945, 68 y.o.   MRN: [id number]  HPI: [name] is a 68 year old female who returns for follow up for chronic pain and medication refill. She says her pain is located in her lower back. She rates her pain 6. She is not following an exercise regime living a sedentary lifestyle. She has been encouraged to increase her activity, she verbalizes understanding. About two weeks ago she was hospitalized at [facility], she was having a choking sensation and SOB,she had a full cardiac workup. Her daughter states" all workup was negative". She is in the process of having GI workup at this time, results pending.  Pain Inventory Average Pain 6 Pain Right Now 6 My pain is constant and unsure of type of pain  In the last 24 hours, has pain interfered with the following? General activity 6 Relation with others 4 Enjoyment of life 5 What TIME of day is your pain at its worst? morning Sleep (in general) Fair  Pain is worse with: walking, bending and standing Pain improves with: rest, heat/ice, pacing activities and medication Relief from Meds: na   Mobility walk without assistance ability to climb steps?  yes  Function retired  Neuro/Psych bladder control problems weakness numbness tingling anxiety  Prior Studies Any changes since last visit?  no  Physicians involved in your care Any changes since last visit?  no   Family History  Problem Relation Age of Onset  . Heart disease Father   . Factor V Leiden deficiency Father   . Factor V Leiden deficiency Brother   . Factor V Leiden deficiency Daughter    History   Social History  . Marital Status: Married    Spouse Name: N/A    Number of Children: N/A  . Years of Education: N/A   Social History Main Topics  . Smoking status: Current Some Day Smoker  . Smokeless tobacco: Never Used     Comment: Smokes e cigarettes  . Alcohol Use: No  .  Drug Use: None  . Sexual Activity: None   Other Topics Concern  . None   Social History Narrative  . None   Past Surgical History  Procedure Laterality Date  . Cataract extraction    . Cholecystectomy    . Abdominal hysterectomy    . Breast surgery     Past Medical History  Diagnosis Date  . Hypertension   . Arthritis   . Cataract   . COPD (chronic obstructive pulmonary disease)   . Factor 5 Leiden mutation, heterozygous   . Myocardial infarction    BP 151/61  Pulse 58  Resp 14  Ht 5\' 5"  (1.651 m)  Wt 168 lb (76.204 kg)  BMI 27.96 kg/m2  SpO2 92%  Opioid Risk Score:   Fall Risk Score: Low Fall Risk (0-5 points) Review of Systems     Objective:   Physical Exam  Nursing note and vitals reviewed. Constitutional: She is oriented to person, place, and time. She appears well-developed and well-nourished.  HENT:  Head: Normocephalic and atraumatic.  Neck: Normal range of motion. Neck supple.  Cardiovascular: Normal rate and regular rhythm.   Pulmonary/Chest: Effort normal and breath sounds normal.  Musculoskeletal:  Normal Muscle Bulk and Muscle testing Reveals: Upper Extremities: Full ROM and Muscle Strength 5/5 Lumbar Paraspinal Tenderness: L-3- L-5 Lower Extremities: Full ROM and Muscle Strength 5/5 Arises from chair with ease Narrow Based  Gait  Neurological: She is alert and oriented to person, place, and time.  Skin: Skin is warm and dry.  Psychiatric: She has a normal mood and affect.          Assessment & Plan:  1. History of lumbar spinal stenosis:  Refilled: Hydrocodone 5/325mg  one tablet every 8 hours #95  2. Recent left occipital infarct, history of factor V 5 Leiden deficiency: On Plavix. Continue to monitor  3. Fibromyalgia syndrome: Continue with Effexor: She has been encouraged to continue to increase her activity.  4. Polymyalgia rheumatica followup with rheumatology  5. Chronic pain syndrome: Encouraged increase activity, heat, and ice  therapy. 15 minutes of face to face patient care time was spent during this visit. All questions were encouraged and answered.  F/U in 1 month

## 2014-10-11 ENCOUNTER — Encounter: Payer: Medicare Other | Attending: Physical Medicine & Rehabilitation

## 2014-10-11 ENCOUNTER — Ambulatory Visit (HOSPITAL_BASED_OUTPATIENT_CLINIC_OR_DEPARTMENT_OTHER): Payer: Medicare Other | Admitting: Physical Medicine & Rehabilitation

## 2014-10-11 ENCOUNTER — Other Ambulatory Visit: Payer: Self-pay | Admitting: Physical Medicine & Rehabilitation

## 2014-10-11 ENCOUNTER — Encounter: Payer: Self-pay | Admitting: Physical Medicine & Rehabilitation

## 2014-10-11 VITALS — BP 142/60 | HR 60 | Resp 14 | Ht 64.0 in | Wt 168.0 lb

## 2014-10-11 DIAGNOSIS — Z79899 Other long term (current) drug therapy: Secondary | ICD-10-CM | POA: Insufficient documentation

## 2014-10-11 DIAGNOSIS — M353 Polymyalgia rheumatica: Secondary | ICD-10-CM | POA: Diagnosis not present

## 2014-10-11 DIAGNOSIS — G894 Chronic pain syndrome: Secondary | ICD-10-CM | POA: Insufficient documentation

## 2014-10-11 DIAGNOSIS — M48061 Spinal stenosis, lumbar region without neurogenic claudication: Secondary | ICD-10-CM

## 2014-10-11 DIAGNOSIS — M4806 Spinal stenosis, lumbar region: Secondary | ICD-10-CM | POA: Insufficient documentation

## 2014-10-11 DIAGNOSIS — Z5181 Encounter for therapeutic drug level monitoring: Secondary | ICD-10-CM

## 2014-10-11 MED ORDER — VENLAFAXINE HCL 25 MG PO TABS: 25.0000 mg | ORAL_TABLET | Freq: Every day | ORAL | Status: DC

## 2014-10-11 MED ORDER — HYDROCODONE-ACETAMINOPHEN 5-325 MG PO TABS: 1.0000 | ORAL_TABLET | Freq: Three times a day (TID) | ORAL | Status: DC | PRN

## 2014-10-11 NOTE — Progress Notes (Signed)
Subjective:    Patient ID: [name], female    DOB: 1946/02/16, 68 y.o.   MRN: [id number]  HPI Back and foot pain ok Having problems with esophageal spasms causing chest pain, following up with gastroenterology in regards to this Pain Inventory Average Pain 6 Pain Right Now 7 My pain is constant, dull and aching  In the last 24 hours, has pain interfered with the following? General activity 6 Relation with others 3 Enjoyment of life 5 What TIME of day is your pain at its worst? morning  And evening Sleep (in general) Fair  Pain is worse with: walking, bending and standing Pain improves with: heat/ice and medication Relief from Meds: 6  Mobility walk without assistance ability to climb steps?  yes do you drive?  yes  Function retired  Neuro/Psych bladder control problems weakness numbness tingling spasms anxiety  Prior Studies Any changes since last visit?  no  Physicians involved in your care Any changes since last visit?  no   Family History  Problem Relation Age of Onset  . Heart disease Father   . Factor V Leiden deficiency Father   . Factor V Leiden deficiency Brother   . Factor V Leiden deficiency Daughter    History   Social History  . Marital Status: Married    Spouse Name: N/A    Number of Children: N/A  . Years of Education: N/A   Social History Main Topics  . Smoking status: Current Some Day Smoker  . Smokeless tobacco: Never Used     Comment: Smokes e cigarettes  . Alcohol Use: No  . Drug Use: None  . Sexual Activity: None   Other Topics Concern  . None   Social History Narrative   Past Surgical History  Procedure Laterality Date  . Cataract extraction    . Cholecystectomy    . Abdominal hysterectomy    . Breast surgery     Past Medical History  Diagnosis Date  . Hypertension   . Arthritis   . Cataract   . COPD (chronic obstructive pulmonary disease)   . Factor 5 Leiden mutation, heterozygous   . Myocardial  infarction    BP 142/60 mmHg  Pulse 60  Resp 14  Ht 5\' 4"  (1.626 m)  Wt 168 lb (76.204 kg)  BMI 28.82 kg/m2  SpO2 91%  Opioid Risk Score:   Fall Risk Score: Low Fall Risk (0-5 points)  Review of Systems  HENT: Positive for trouble swallowing.   Eyes: Negative.   Respiratory: Positive for shortness of breath and wheezing.        Sleep apnea  Cardiovascular: Negative.   Gastrointestinal: Negative.   Endocrine: Negative.   Genitourinary: Negative.   Musculoskeletal: Positive for myalgias, back pain and arthralgias.  Skin: Negative.   Allergic/Immunologic: Negative.   Neurological: Positive for weakness and numbness.  Hematological: Negative.   Psychiatric/Behavioral: The patient is nervous/anxious.        Objective:   Physical Exam  Constitutional: She is oriented to person, place, and time. She appears well-developed and well-nourished.  HENT:  Head: Normocephalic and atraumatic.  Eyes: Pupils are equal, round, and reactive to light.  Neck: Normal range of motion.  Musculoskeletal: Normal range of motion.  Neurological: She is alert and oriented to person, place, and time.  Psychiatric: She has a normal mood and affect.  Nursing note and vitals reviewed.         Assessment & Plan:  1. History of lumbar spinal  stenosis:  Refilled: Hydrocodone 5/325mg  one tablet every 8 hours #95 , Doing well from pain standpoint. 2. Recent left occipital infarct, history of factor V 5 Leiden deficiency: On Plavix and warfarin. Continue to monitor  3. Fibromyalgia syndrome: Continue with Effexor: She has been encouraged to continue to increase her activity. Effexor has been helpful for her overall body aches 4. Polymyalgia rheumatica followup with rheumatology taking prednisone low dose   F/U in 1 month

## 2014-10-11 NOTE — Patient Instructions (Signed)
Recommend walking 10 minutes 3 times per day  Follow-up with nurse practitioner one month  Continue Effexor

## 2014-10-12 LAB — PMP ALCOHOL METABOLITE (ETG): Ethyl Glucuronide (EtG): NEGATIVE ng/mL

## 2014-10-16 LAB — OPIATES/OPIOIDS (LC/MS-MS)
Codeine Urine: NEGATIVE ng/mL (ref <50)
Hydrocodone: 1133 ng/mL (ref <50)
Hydromorphone: 384 ng/mL (ref <50)
Morphine Urine: NEGATIVE ng/mL (ref <50)
Norhydrocodone, Ur: 1328 ng/mL (ref <50)
Noroxycodone, Ur: NEGATIVE ng/mL (ref <50)
Oxycodone, ur: NEGATIVE ng/mL (ref <50)
Oxymorphone: NEGATIVE ng/mL (ref <50)

## 2014-10-16 LAB — PRESCRIPTION MONITORING PROFILE (SOLSTAS)
Amphetamine/Meth: NEGATIVE ng/mL
Barbiturate Screen, Urine: NEGATIVE ng/mL
Buprenorphine, Urine: NEGATIVE ng/mL
CANNABINOID SCRN UR: NEGATIVE ng/mL
CARISOPRODOL, URINE: NEGATIVE ng/mL
Cocaine Metabolites: NEGATIVE ng/mL
Creatinine, Urine: 119.91 mg/dL (ref >20.0)
FENTANYL URINE: NEGATIVE ng/mL
MDMA URINE: NEGATIVE ng/mL
Meperidine, Ur: NEGATIVE ng/mL
Methadone Screen, Urine: NEGATIVE ng/mL
Nitrites, Initial: NEGATIVE ug/mL
Oxycodone Screen, Ur: NEGATIVE ng/mL
Propoxyphene: NEGATIVE ng/mL
TAPENTADOLUR: NEGATIVE ng/mL
Tramadol Scrn, Ur: NEGATIVE ng/mL
Zolpidem, Urine: NEGATIVE ng/mL
pH, Initial: 6.7 pH (ref 4.5–8.9)

## 2014-10-16 LAB — BENZODIAZEPINES (GC/LC/MS), URINE
Alprazolam metabolite (GC/LC/MS), ur confirm: 253 ng/mL — AB (ref <25)
Clonazepam metabolite (GC/LC/MS), ur confirm: NEGATIVE ng/mL (ref <25)
FLURAZEPAMU: NEGATIVE ng/mL (ref <50)
Lorazepam (GC/LC/MS), ur confirm: NEGATIVE ng/mL (ref <50)
Midazolam (GC/LC/MS), ur confirm: NEGATIVE ng/mL (ref <50)
NORDIAZEPAMU: NEGATIVE ng/mL (ref <50)
OXAZEPAMU: NEGATIVE ng/mL (ref <50)
Temazepam (GC/LC/MS), ur confirm: NEGATIVE ng/mL (ref <50)
Triazolam metabolite (GC/LC/MS), ur confirm: NEGATIVE ng/mL (ref <50)

## 2014-10-27 NOTE — Progress Notes (Signed)
Urine drug screen for this encounter is consistent for prescribed medication 

## 2014-11-15 ENCOUNTER — Encounter: Payer: Self-pay | Admitting: Registered Nurse

## 2014-11-15 ENCOUNTER — Encounter: Payer: Medicare Other | Attending: Physical Medicine & Rehabilitation | Admitting: Registered Nurse

## 2014-11-15 VITALS — BP 144/60 | HR 60 | Resp 14

## 2014-11-15 DIAGNOSIS — G894 Chronic pain syndrome: Secondary | ICD-10-CM | POA: Insufficient documentation

## 2014-11-15 DIAGNOSIS — Z79899 Other long term (current) drug therapy: Secondary | ICD-10-CM | POA: Diagnosis not present

## 2014-11-15 DIAGNOSIS — M353 Polymyalgia rheumatica: Secondary | ICD-10-CM | POA: Insufficient documentation

## 2014-11-15 DIAGNOSIS — M48061 Spinal stenosis, lumbar region without neurogenic claudication: Secondary | ICD-10-CM

## 2014-11-15 DIAGNOSIS — M4806 Spinal stenosis, lumbar region: Secondary | ICD-10-CM | POA: Diagnosis not present

## 2014-11-15 DIAGNOSIS — Z5181 Encounter for therapeutic drug level monitoring: Secondary | ICD-10-CM | POA: Diagnosis not present

## 2014-11-15 MED ORDER — HYDROCODONE-ACETAMINOPHEN 5-325 MG PO TABS: 1.0000 | ORAL_TABLET | Freq: Three times a day (TID) | ORAL | Status: DC | PRN

## 2014-11-15 NOTE — Progress Notes (Signed)
Subjective:    Patient ID: [name] Mill, female    DOB: 1946-05-03, 68 y.o.   MRN: [id number]  HPI: [name] Aja is a 68 year old female who returns for follow up for chronic pain and medication refill. She says her usual pain is located in her lower extremities, at this time she is pain free. She rates her pain 7. Her current exercise regime is walking and performing stretching exercises. Daughter in room all questions answered.  Pain Inventory Average Pain 7 Pain Right Now 7 My pain is intermittent, dull, tingling and aching  In the last 24 hours, has pain interfered with the following? General activity 5 Relation with others 3 Enjoyment of life 5 What TIME of day is your pain at its worst? evening Sleep (in general) Fair  Pain is worse with: walking, bending, sitting, standing and some activites Pain improves with: rest and medication Relief from Meds: 7  Mobility walk without assistance  Function disabled: date disabled .  Neuro/Psych tingling spasms anxiety  Prior Studies Any changes since last visit?  no  Physicians involved in your care Any changes since last visit?  no   BP 144/60 P60 R 14 O2 92    Family History  Problem Relation Age of Onset  . Heart disease Father   . Factor V Leiden deficiency Father   . Factor V Leiden deficiency Brother   . Factor V Leiden deficiency Daughter    History   Social History  . Marital Status: Married    Spouse Name: N/A    Number of Children: N/A  . Years of Education: N/A   Social History Main Topics  . Smoking status: Current Some Day Smoker  . Smokeless tobacco: Never Used     Comment: Smokes e cigarettes  . Alcohol Use: No  . Drug Use: None  . Sexual Activity: None   Other Topics Concern  . None   Social History Narrative   Past Surgical History  Procedure Laterality Date  . Cataract extraction    . Cholecystectomy    . Abdominal hysterectomy    . Breast surgery     Past Medical  History  Diagnosis Date  . Hypertension   . Arthritis   . Cataract   . COPD (chronic obstructive pulmonary disease)   . Factor 5 Leiden mutation, heterozygous   . Myocardial infarction    BP 134/86 mmHg  Pulse 100  Resp 14  SpO2 94%  Opioid Risk Score:   Fall Risk Score:    Review of Systems  Constitutional: Negative.   HENT: Negative.   Eyes: Negative.   Respiratory: Negative.   Cardiovascular: Negative.   Gastrointestinal: Negative.   Endocrine: Negative.   Genitourinary: Negative.   Musculoskeletal: Positive for back pain.  Skin: Negative.   Allergic/Immunologic: Negative.   Neurological: Negative.   Hematological: Negative.   Psychiatric/Behavioral: Positive for dysphoric mood.       Objective:   Physical Exam  Constitutional: She is oriented to person, place, and time. She appears well-developed and well-nourished.  HENT:  Head: Normocephalic and atraumatic.  Neck: Normal range of motion. Neck supple.  Cardiovascular: Normal rate and regular rhythm.   Pulmonary/Chest: Effort normal and breath sounds normal.  Musculoskeletal:  Normal Muscle Bulk and Muscle Testing Reveals: Upper Extremities: Full ROM and Muscle strength 5/5 Back without spinal or paraspinal tenderness Lower Extremities: Full ROM and Muscle strength 5/5 Arises from chair with ease Narrow Based gait.  Neurological: She is  alert and oriented to person, place, and time.  Skin: Skin is warm and dry.  Psychiatric: She has a normal mood and affect.  Nursing note and vitals reviewed.         Assessment & Plan:  1. History of lumbar spinal stenosis:  Refilled: Hydrocodone 5/325mg  one tablet every 8 hours #95  2. Recent left occipital infarct, history of factor V 5 Leiden deficiency: On Plavix. Continue to monitor  3. Fibromyalgia syndrome: Continue with Effexor: She has been encouraged to continue to increase her activity.  4. Polymyalgia rheumatica followup with rheumatology  5.  Chronic pain syndrome: Encouraged increase activity, heat, and ice therapy. 15 minutes of face to face patient care time was spent during this visit. All questions were encouraged and answered.  F/U in 1 month

## 2014-12-09 IMAGING — CR DG HIP W/ PELVIS BILAT
5 series · 5 of 5 positions shown · non-contrast
Comparison: None

CLINICAL DATA: Bilateral hip pain, history of lumbar spinal
stenosis

EXAM:
BILATERAL HIP WITH PELVIS - 4+ VIEW

[t pelvis a.p.]
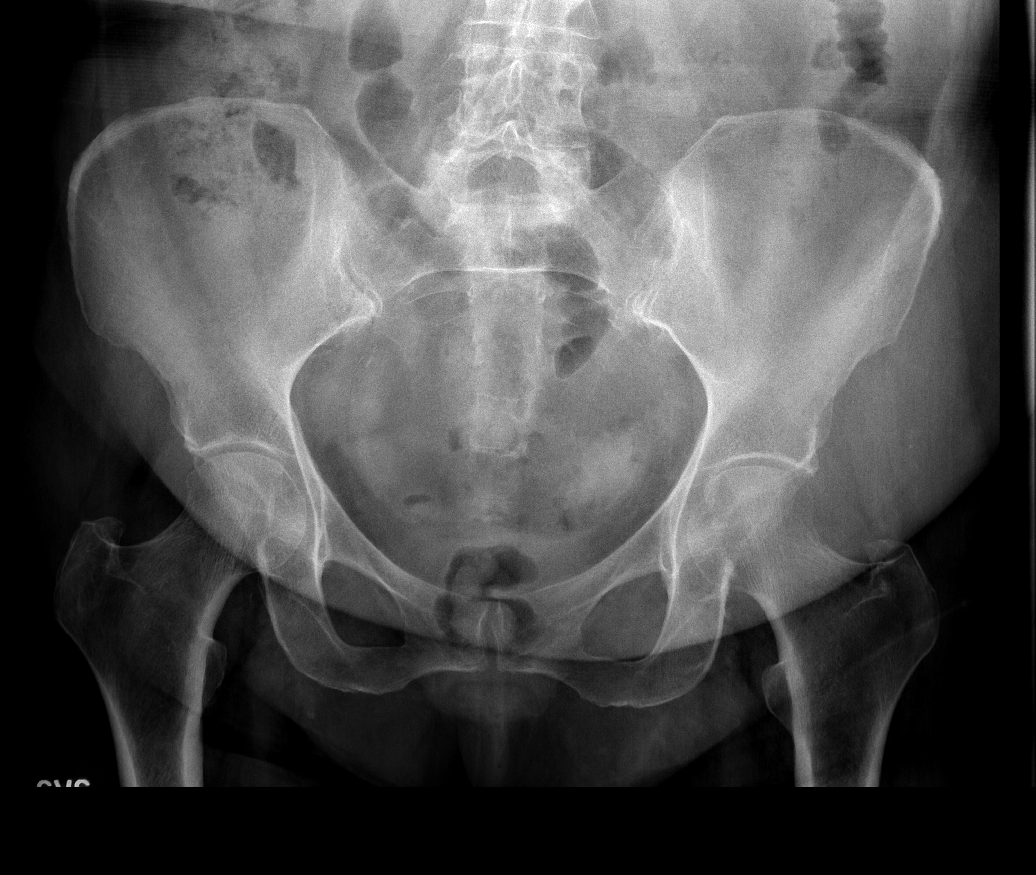

[t hip ap left]
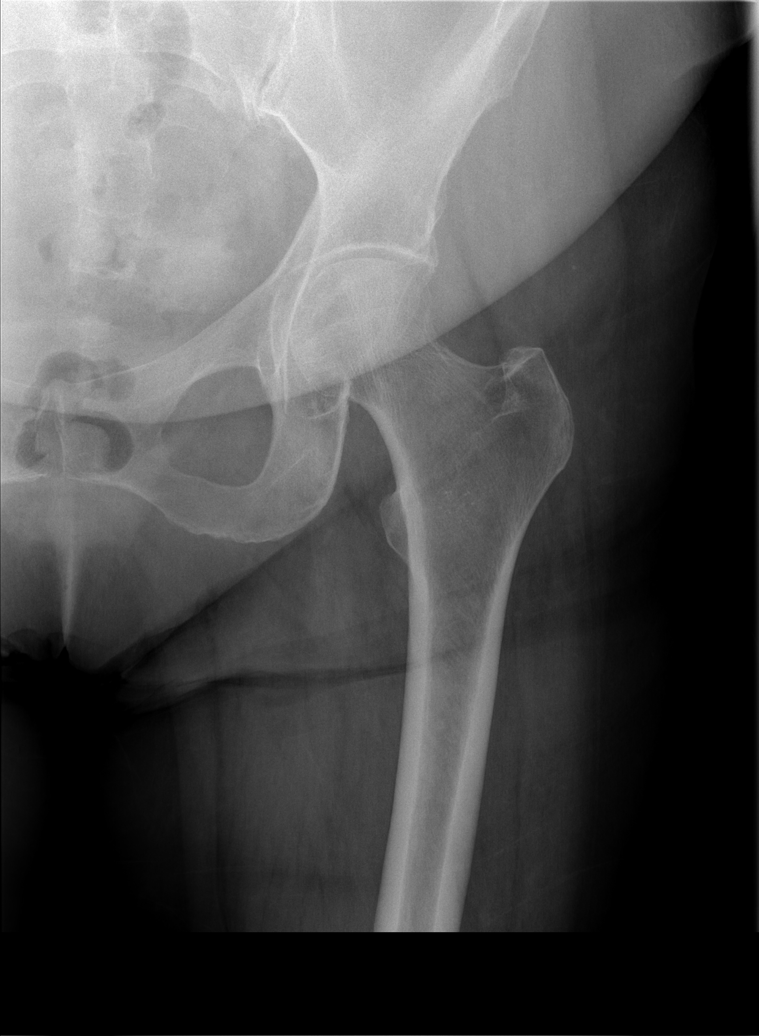

[t hip ap right]
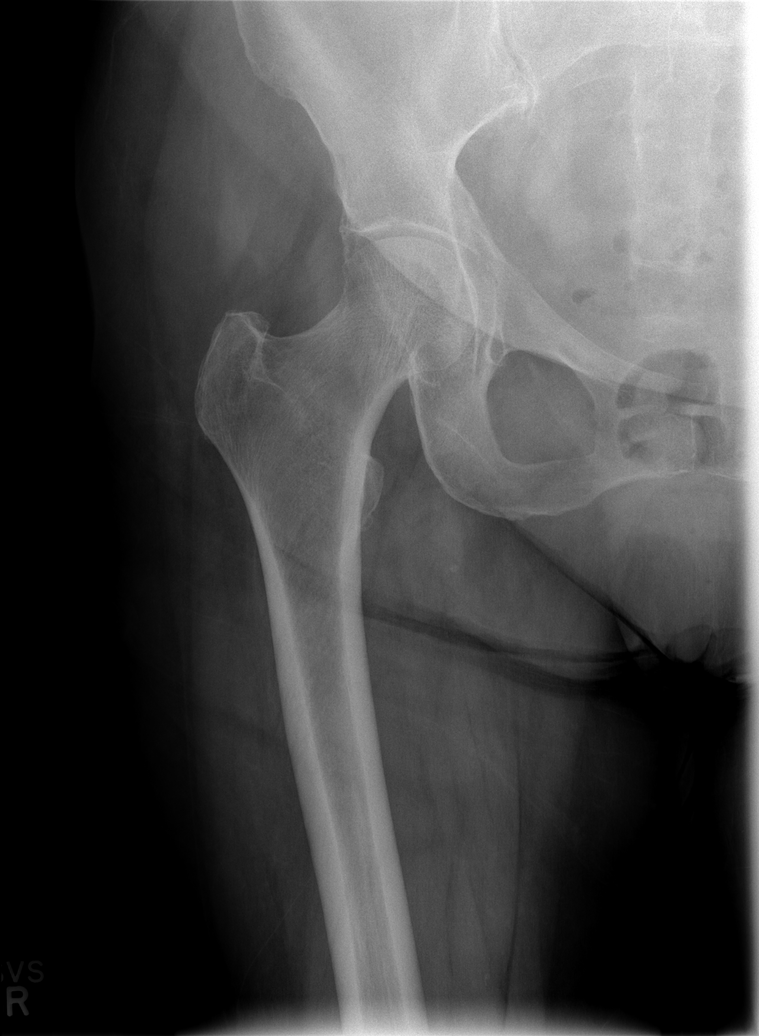

[t hip frog leg left]
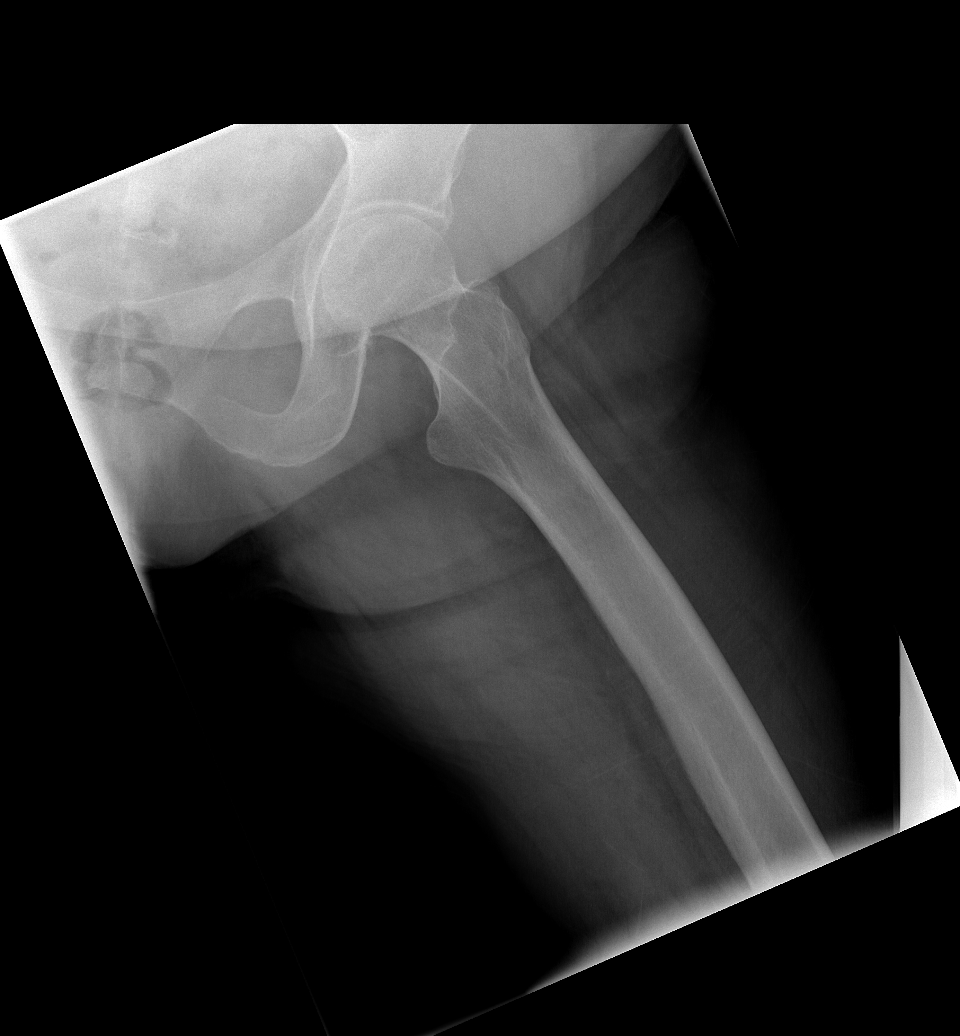

[t hip frog leg right]
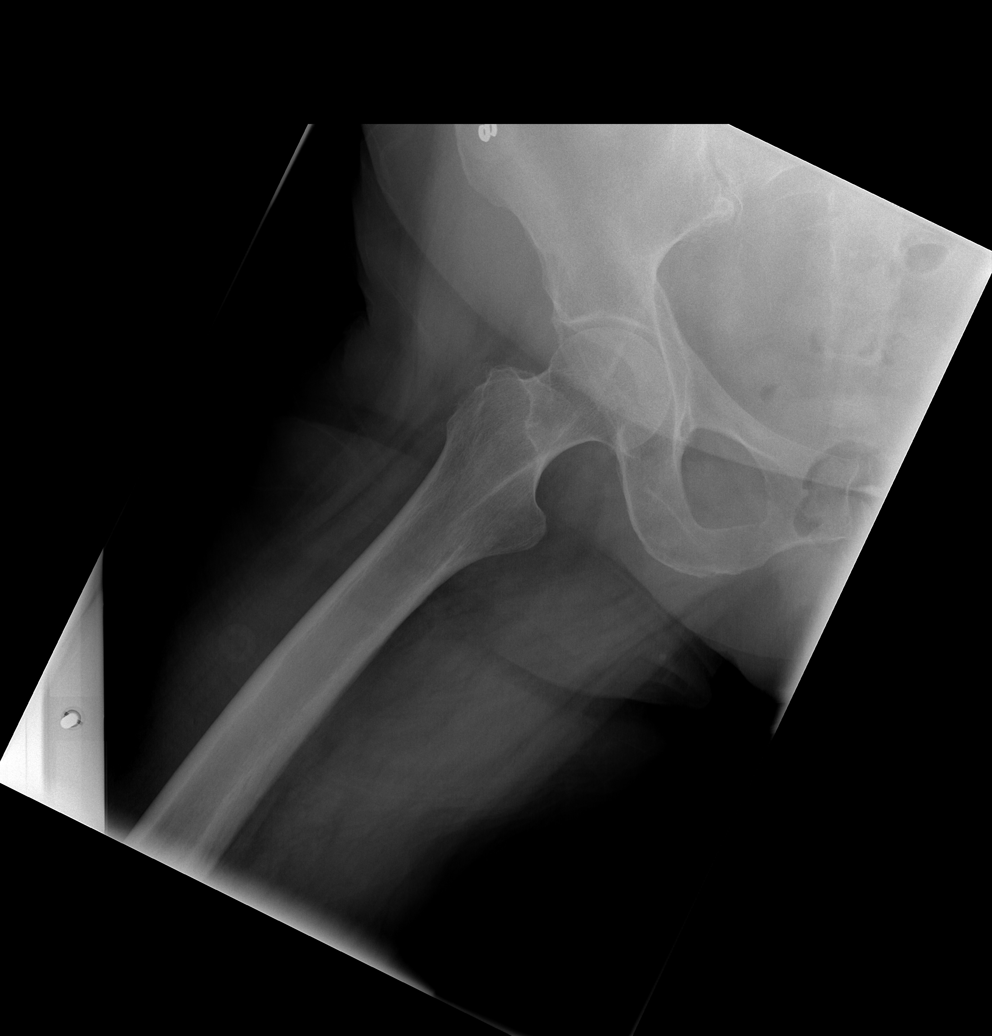

[5 of 5 positions shown; findings below may reference images not displayed]

FINDINGS: Diffuse osseous demineralization.

Hip and SI joint spaces preserved.

Question mild facet degenerative changes lower lumbar spine.

No acute fracture, dislocation, or bone destruction.
IMPRESSION: No acute osseous abnormalities.

Question mild facet degenerative changes lower lumbar spine

## 2014-12-20 ENCOUNTER — Encounter: Payer: Medicare Other | Attending: Physical Medicine & Rehabilitation | Admitting: Registered Nurse

## 2014-12-20 ENCOUNTER — Encounter: Payer: Self-pay | Admitting: Registered Nurse

## 2014-12-20 VITALS — BP 143/80 | HR 60 | Resp 14

## 2014-12-20 DIAGNOSIS — Z79899 Other long term (current) drug therapy: Secondary | ICD-10-CM | POA: Diagnosis not present

## 2014-12-20 DIAGNOSIS — M4806 Spinal stenosis, lumbar region: Secondary | ICD-10-CM | POA: Diagnosis not present

## 2014-12-20 DIAGNOSIS — Z5181 Encounter for therapeutic drug level monitoring: Secondary | ICD-10-CM | POA: Diagnosis not present

## 2014-12-20 DIAGNOSIS — G894 Chronic pain syndrome: Secondary | ICD-10-CM | POA: Insufficient documentation

## 2014-12-20 DIAGNOSIS — M353 Polymyalgia rheumatica: Secondary | ICD-10-CM | POA: Diagnosis not present

## 2014-12-20 DIAGNOSIS — M48061 Spinal stenosis, lumbar region without neurogenic claudication: Secondary | ICD-10-CM

## 2014-12-20 MED ORDER — HYDROCODONE-ACETAMINOPHEN 5-325 MG PO TABS: 1.0000 | ORAL_TABLET | Freq: Three times a day (TID) | ORAL | Status: DC | PRN

## 2014-12-20 MED ORDER — VENLAFAXINE HCL 25 MG PO TABS: 25.0000 mg | ORAL_TABLET | Freq: Every day | ORAL | Status: DC

## 2014-12-20 NOTE — Progress Notes (Signed)
Subjective:    Patient ID: [name], female    DOB: 1946/08/01, 69 y.o.   MRN: [id number]  HPI: [name] is a 69 year old female who returns for follow up for chronic pain and medication refill. She says her pain is located in her neck. She rates her pain 5. Her current exercise regime is walking and performing stretching exercises. Daughter in room all questions answered.   Pain Inventory Average Pain 5 Pain Right Now 5 My pain is constant, burning, dull and tingling  In the last 24 hours, has pain interfered with the following? General activity 4 Relation with others 3 Enjoyment of life 4 What TIME of day is your pain at its worst? morning daytime and evening Sleep (in general) Fair  Pain is worse with: walking, bending, sitting, standing and some activites Pain improves with: rest, heat/ice, pacing activities and medication Relief from Meds: 6  Mobility walk without assistance ability to climb steps?  yes do you drive?  yes  Function retired I need assistance with the following:  meal prep, household duties and shopping  Neuro/Psych bladder control problems weakness numbness tingling spasms anxiety  Prior Studies Any changes since last visit?  no  Physicians involved in your care Any changes since last visit?  no   Family History  Problem Relation Age of Onset  . Heart disease Father   . Factor V Leiden deficiency Father   . Factor V Leiden deficiency Brother   . Factor V Leiden deficiency Daughter    History   Social History  . Marital Status: Married    Spouse Name: N/A    Number of Children: N/A  . Years of Education: N/A   Social History Main Topics  . Smoking status: Current Some Day Smoker  . Smokeless tobacco: Never Used     Comment: Smokes e cigarettes  . Alcohol Use: No  . Drug Use: None  . Sexual Activity: None   Other Topics Concern  . None   Social History Narrative   Past Surgical History  Procedure  Laterality Date  . Cataract extraction    . Cholecystectomy    . Abdominal hysterectomy    . Breast surgery     Past Medical History  Diagnosis Date  . Hypertension   . Arthritis   . Cataract   . COPD (chronic obstructive pulmonary disease)   . Factor 5 Leiden mutation, heterozygous   . Myocardial infarction    BP 143/80 mmHg  Pulse 60  Resp 14  SpO2 91%  Opioid Risk Score:   Fall Risk Score: Moderate Fall Risk (6-13 points) (previously educated and given handout) Review of Systems  Constitutional: Positive for unexpected weight change.  Respiratory: Positive for shortness of breath and wheezing.   Cardiovascular: Positive for leg swelling.  Gastrointestinal: Positive for constipation.  Genitourinary:       Bladder control problems  Musculoskeletal:       Spasms  Neurological: Positive for numbness.       Tingling  Hematological: Bruises/bleeds easily.  Psychiatric/Behavioral: The patient is nervous/anxious.   All other systems reviewed and are negative.      Objective:   Physical Exam  Constitutional: She is oriented to person, place, and time. She appears well-developed and well-nourished.  HENT:  Head: Normocephalic and atraumatic.  Neck: Normal range of motion. Neck supple.  Cervical Paraspinal Tenderness: C-3- C-5  Cardiovascular: Normal rate and regular rhythm.   Pulmonary/Chest: Effort normal and breath sounds  normal.  Musculoskeletal:  Normal Muscle Bulk and Muscle Testing Reveals: Upper Extremities: Full ROM and Muscle strength 5/5 Lumbar Paraspinal Tenderness: L-3- L-5 Lower Extremities: Full ROM and Muscle Strength 5/5 Arises from chair with ease Narrow Based Gait   Neurological: She is alert and oriented to person, place, and time.  Skin: Skin is warm and dry.  Psychiatric: She has a normal mood and affect.  Nursing note and vitals reviewed.         Assessment & Plan:  1. History of lumbar spinal stenosis:  Refilled: Hydrocodone 5/325mg   one tablet every 8 hours #95  2. Recent left occipital infarct, history of factor V 5 Leiden deficiency: On Plavix. Continue to monitor  3. Fibromyalgia syndrome: Continue with Effexor: She has been encouraged to continue to increase her activity.  4. Polymyalgia rheumatica followup with rheumatology  5. Chronic pain syndrome: Encouraged increase activity, heat, and ice therapy. 15 minutes of face to face patient care time was spent during this visit. All questions were encouraged and answered.  F/U in 1 month

## 2015-01-18 ENCOUNTER — Encounter: Payer: Medicare Other | Attending: Physical Medicine & Rehabilitation | Admitting: Registered Nurse

## 2015-01-18 ENCOUNTER — Encounter: Payer: Self-pay | Admitting: Registered Nurse

## 2015-01-18 VITALS — BP 136/60 | HR 66 | Resp 14

## 2015-01-18 DIAGNOSIS — Z79899 Other long term (current) drug therapy: Secondary | ICD-10-CM | POA: Insufficient documentation

## 2015-01-18 DIAGNOSIS — M48061 Spinal stenosis, lumbar region without neurogenic claudication: Secondary | ICD-10-CM

## 2015-01-18 DIAGNOSIS — Z5181 Encounter for therapeutic drug level monitoring: Secondary | ICD-10-CM | POA: Insufficient documentation

## 2015-01-18 DIAGNOSIS — M4806 Spinal stenosis, lumbar region: Secondary | ICD-10-CM | POA: Diagnosis not present

## 2015-01-18 DIAGNOSIS — M353 Polymyalgia rheumatica: Secondary | ICD-10-CM | POA: Diagnosis not present

## 2015-01-18 DIAGNOSIS — G894 Chronic pain syndrome: Secondary | ICD-10-CM | POA: Diagnosis present

## 2015-01-18 MED ORDER — HYDROCODONE-ACETAMINOPHEN 5-325 MG PO TABS: 1.0000 | ORAL_TABLET | Freq: Three times a day (TID) | ORAL | Status: DC | PRN

## 2015-01-18 NOTE — Progress Notes (Signed)
Subjective:    Patient ID: Brandy EnsignDixie Name, female    DOB: 1946/07/26, 69 y.o.   MRN: [id number]  HPI:[name] is a 69 year old female who returns for follow up for chronic pain and medication refill. She says her usual pain is located in her lower back, at this time she is pain free. Yesterday she  rated her pain 6. Her current exercise regime is walking, performing stretching and chair exercises with free weights nightly. Daughter in room all questions answered.  Pain Inventory Average Pain 6 Pain Right Now 6 My pain is constant, sharp, dull, tingling and aching  In the last 24 hours, has pain interfered with the following? General activity 6 Relation with others 3 Enjoyment of life 4 What TIME of day is your pain at its worst? morning Sleep (in general) Fair  Pain is worse with: walking, bending, sitting and standing Pain improves with: heat/ice and medication Relief from Meds: 6  Mobility walk without assistance how many minutes can you walk? 15 ability to climb steps?  yes do you drive?  yes  Function retired  Neuro/Psych bladder control problems weakness numbness tingling spasms anxiety  Prior Studies Any changes since last visit?  no  Physicians involved in your care Any changes since last visit?  no   Family History  Problem Relation Age of Onset  . Heart disease Father   . Factor V Leiden deficiency Father   . Factor V Leiden deficiency Brother   . Factor V Leiden deficiency Daughter    History   Social History  . Marital Status: Married    Spouse Name: N/A  . Number of Children: N/A  . Years of Education: N/A   Social History Main Topics  . Smoking status: Current Some Day Smoker  . Smokeless tobacco: Never Used     Comment: Smokes e cigarettes  . Alcohol Use: No  . Drug Use: Not on file  . Sexual Activity: Not on file   Other Topics Concern  . None   Social History Narrative   Past Surgical History  Procedure  Laterality Date  . Cataract extraction    . Cholecystectomy    . Abdominal hysterectomy    . Breast surgery     Past Medical History  Diagnosis Date  . Hypertension   . Arthritis   . Cataract   . COPD (chronic obstructive pulmonary disease)   . Factor 5 Leiden mutation, heterozygous   . Myocardial infarction    BP 136/60 mmHg  Pulse 66  Resp 14  SpO2 93%  Opioid Risk Score:   Fall Risk Score: Low Fall Risk (0-5 points)  Review of Systems  HENT: Negative.   Eyes: Negative.   Respiratory: Negative.   Cardiovascular: Negative.   Gastrointestinal: Negative.   Endocrine: Negative.   Genitourinary:       Bladder control problems  Musculoskeletal: Positive for myalgias, back pain and arthralgias.  Skin: Negative.   Allergic/Immunologic: Negative.   Neurological: Positive for weakness and numbness.       Spasms, tingling  Hematological: Negative.   Psychiatric/Behavioral: The patient is nervous/anxious.        Objective:   Physical Exam  Constitutional: She is oriented to person, place, and time. She appears well-developed and well-nourished.  HENT:  Head: Normocephalic and atraumatic.  Neck: Normal range of motion. Neck supple.  Cardiovascular: Normal rate and regular rhythm.   Pulmonary/Chest: Effort normal and breath sounds normal.  Musculoskeletal:  Normal Muscle  Bulk and Muscle Testing Reveals: Upper Extremities: Full ROM and Muscle Strength 5/5 Back without spinal or paraspinal tenderness Lower Extremities: Full ROM and Muscle Strength 5/5 Arises from chair with ease Narrow Based gait    Neurological: She is alert and oriented to person, place, and time.  Skin: Skin is warm and dry.  Psychiatric: She has a normal mood and affect.  Nursing note and vitals reviewed.         Assessment & Plan:  1. History of lumbar spinal stenosis:  Refilled: Hydrocodone 5/325mg  one tablet every 8 hours #95  2. Recent left occipital infarct, history of factor V 5  Leiden deficiency: On Plavix. Continue to monitor  3. Fibromyalgia syndrome: Continue with Effexor:Continue with exercise regime. 4. Polymyalgia rheumatica followup with rheumatology  5. Chronic pain syndrome: Continue with exercise, heat, and ice therapy. 15 minutes of face to face patient care time was spent during this visit. All questions were encouraged and answered.  F/U in 1 month

## 2015-02-18 ENCOUNTER — Encounter: Payer: Self-pay | Admitting: Registered Nurse

## 2015-02-18 ENCOUNTER — Encounter: Payer: Medicare Other | Attending: Physical Medicine & Rehabilitation | Admitting: Registered Nurse

## 2015-02-18 VITALS — BP 144/55 | HR 63 | Resp 14

## 2015-02-18 DIAGNOSIS — Z79899 Other long term (current) drug therapy: Secondary | ICD-10-CM

## 2015-02-18 DIAGNOSIS — M48061 Spinal stenosis, lumbar region without neurogenic claudication: Secondary | ICD-10-CM

## 2015-02-18 DIAGNOSIS — M353 Polymyalgia rheumatica: Secondary | ICD-10-CM | POA: Insufficient documentation

## 2015-02-18 DIAGNOSIS — Z5181 Encounter for therapeutic drug level monitoring: Secondary | ICD-10-CM | POA: Diagnosis not present

## 2015-02-18 DIAGNOSIS — M4806 Spinal stenosis, lumbar region: Secondary | ICD-10-CM

## 2015-02-18 DIAGNOSIS — G894 Chronic pain syndrome: Secondary | ICD-10-CM | POA: Diagnosis not present

## 2015-02-18 MED ORDER — HYDROCODONE-ACETAMINOPHEN 5-325 MG PO TABS: 1.0000 | ORAL_TABLET | Freq: Three times a day (TID) | ORAL | Status: DC | PRN

## 2015-02-18 NOTE — Progress Notes (Signed)
Subjective:    Patient ID: [name], female    DOB: Apr 18, 1946, 69 y.o.   MRN: [id number]  HPI: [name] is a 69 year old female who returns for follow up for chronic pain and medication refill. She states she's pain free, her usual pain is located in her lower back.Yesterday she rated her pain 5. Her current exercise regime is walking, performing stretching and chair exercises with free weights nightly. Daughter in room all questions answered.  Pain Inventory Average Pain 5 Pain Right Now 5 My pain is constant, burning, dull, tingling and aching  In the last 24 hours, has pain interfered with the following? General activity 5 Relation with others 4 Enjoyment of life 6 What TIME of day is your pain at its worst? morning, night Sleep (in general) Fair  Pain is worse with: walking, bending, sitting and standing Pain improves with: rest, heat/ice, pacing activities and medication Relief from Meds: 7  Mobility walk without assistance how many minutes can you walk? 30 ability to climb steps?  yes do you drive?  yes Do you have any goals in this area?  yes  Function retired  Neuro/Psych bladder control problems weakness tingling spasms anxiety  Prior Studies Any changes since last visit?  no  Physicians involved in your care Any changes since last visit?  no   Family History  Problem Relation Age of Onset  . Heart disease Father   . Factor V Leiden deficiency Father   . Factor V Leiden deficiency Brother   . Factor V Leiden deficiency Daughter    History   Social History  . Marital Status: Married    Spouse Name: N/A  . Number of Children: N/A  . Years of Education: N/A   Social History Main Topics  . Smoking status: Current Some Day Smoker  . Smokeless tobacco: Never Used     Comment: Smokes e cigarettes  . Alcohol Use: No  . Drug Use: Not on file  . Sexual Activity: Not on file   Other Topics Concern  . None   Social History  Narrative   Past Surgical History  Procedure Laterality Date  . Cataract extraction    . Cholecystectomy    . Abdominal hysterectomy    . Breast surgery     Past Medical History  Diagnosis Date  . Hypertension   . Arthritis   . Cataract   . COPD (chronic obstructive pulmonary disease)   . Factor 5 Leiden mutation, heterozygous   . Myocardial infarction    BP 144/55 mmHg  Pulse 63  Resp 14  SpO2 91%  Opioid Risk Score:   Fall Risk Score: Low Fall Risk (0-5 points) (patient previously educated)`1  Depression screen PHQ 2/9  Depression screen PHQ 2/9 02/18/2015  Decreased Interest 3  Down, Depressed, Hopeless 0  PHQ - 2 Score 3  Altered sleeping 3  Tired, decreased energy 1  Change in appetite 0  Feeling bad or failure about yourself  0  Trouble concentrating 0  Moving slowly or fidgety/restless 0  Suicidal thoughts 0  PHQ-9 Score 7     Review of Systems  Constitutional:       Weight loss  Respiratory: Positive for shortness of breath.   Cardiovascular: Positive for leg swelling.  Gastrointestinal: Positive for constipation.  Endocrine:       High blood sugar  Genitourinary:       Bladder control problems  Neurological: Positive for weakness.  Tingling  Psychiatric/Behavioral: The patient is nervous/anxious.   All other systems reviewed and are negative.      Objective:   Physical Exam  Constitutional: She is oriented to person, place, and time. She appears well-developed and well-nourished.  HENT:  Head: Normocephalic and atraumatic.  Neck: Normal range of motion. Neck supple.  Cardiovascular: Normal rate and regular rhythm.   Pulmonary/Chest: Effort normal and breath sounds normal.  Musculoskeletal:  Normal Muscle Bulk and Muscle Testing Reveals: Upper Extremities: Full ROM and Muscle Strength 5/5 Back without spinal or paraspinal tenderness Lower Extremities: Full ROM and Muscle Strength 5/5 Arises from chair with ease Narrow Based gait     Neurological: She is alert and oriented to person, place, and time.  Skin: Skin is warm and dry.  Psychiatric: She has a normal mood and affect.  Nursing note and vitals reviewed.         Assessment & Plan:  1. History of lumbar spinal stenosis:  Refilled: Hydrocodone 5/325mg  one tablet every 8 hours #95  2. Recent left occipital infarct, history of factor V 5 Leiden deficiency: On Plavix. Continue to monitor  3. Fibromyalgia syndrome: Continue with Effexor:Continue with exercise regime. Consider QOD due to High INR's. 4. Polymyalgia rheumatica followup with rheumatology  5. Chronic pain syndrome: Continue with exercise, heat, and ice therapy. 15 minutes of face to face patient care time was spent during this visit. All questions were encouraged and answered.  F/U in 1 month

## 2015-03-22 ENCOUNTER — Encounter: Payer: Self-pay | Admitting: Physical Medicine & Rehabilitation

## 2015-03-22 ENCOUNTER — Ambulatory Visit (HOSPITAL_BASED_OUTPATIENT_CLINIC_OR_DEPARTMENT_OTHER): Payer: Medicare Other | Admitting: Physical Medicine & Rehabilitation

## 2015-03-22 ENCOUNTER — Encounter: Payer: Medicare Other | Attending: Physical Medicine & Rehabilitation

## 2015-03-22 VITALS — BP 149/64 | HR 65 | Resp 14

## 2015-03-22 DIAGNOSIS — M4806 Spinal stenosis, lumbar region: Secondary | ICD-10-CM | POA: Diagnosis not present

## 2015-03-22 DIAGNOSIS — M353 Polymyalgia rheumatica: Secondary | ICD-10-CM | POA: Diagnosis not present

## 2015-03-22 DIAGNOSIS — M48061 Spinal stenosis, lumbar region without neurogenic claudication: Secondary | ICD-10-CM

## 2015-03-22 DIAGNOSIS — G894 Chronic pain syndrome: Secondary | ICD-10-CM | POA: Insufficient documentation

## 2015-03-22 DIAGNOSIS — Z5181 Encounter for therapeutic drug level monitoring: Secondary | ICD-10-CM | POA: Diagnosis not present

## 2015-03-22 DIAGNOSIS — Z79899 Other long term (current) drug therapy: Secondary | ICD-10-CM | POA: Insufficient documentation

## 2015-03-22 MED ORDER — HYDROCODONE-ACETAMINOPHEN 5-325 MG PO TABS: 1.0000 | ORAL_TABLET | Freq: Three times a day (TID) | ORAL | Status: DC | PRN

## 2015-03-22 NOTE — Progress Notes (Signed)
   Subjective:    Patient ID: [name], female    DOB: 1946-06-02, 69 y.o.   MRN: [id number]  HPI   Pain Inventory Average Pain 5 Pain Right Now 5 My pain is constant, dull, tingling and aching  In the last 24 hours, has pain interfered with the following? General activity 5 Relation with others 4 Enjoyment of life 5 What TIME of day is your pain at its worst? morning, evening  Sleep (in general) Fair  Pain is worse with: walking, bending, sitting, standing and some activites Pain improves with: rest, heat/ice, pacing activities and medication Relief from Meds: 7  Mobility walk without assistance how many minutes can you walk? 30 ability to climb steps?  yes do you drive?  yes transfers alone Do you have any goals in this area?  yes  Function retired I need assistance with the following:  household duties and shopping  Neuro/Psych bladder control problems weakness numbness trouble walking spasms depression anxiety  Prior Studies Any changes since last visit?  no  Physicians involved in your care Any changes since last visit?  no   Family History  Problem Relation Age of Onset  . Heart disease Father   . Factor V Leiden deficiency Father   . Factor V Leiden deficiency Brother   . Factor V Leiden deficiency Daughter    History   Social History  . Marital Status: Married    Spouse Name: N/A  . Number of Children: N/A  . Years of Education: N/A   Social History Main Topics  . Smoking status: Current Some Day Smoker  . Smokeless tobacco: Never Used     Comment: Smokes e cigarettes  . Alcohol Use: No  . Drug Use: Not on file  . Sexual Activity: Not on file   Other Topics Concern  . None   Social History Narrative   Past Surgical History  Procedure Laterality Date  . Cataract extraction    . Cholecystectomy    . Abdominal hysterectomy    . Breast surgery     Past Medical History  Diagnosis Date  . Hypertension   . Arthritis   .  Cataract   . COPD (chronic obstructive pulmonary disease)   . Factor 5 Leiden mutation, heterozygous   . Myocardial infarction    BP 149/64 mmHg  Pulse 65  Resp 14  SpO2 88%  Opioid Risk Score:   Fall Risk Score: Low Fall Risk (0-5 points)`1  Depression screen PHQ 2/9  Depression screen PHQ 2/9 02/18/2015  Decreased Interest 3  Down, Depressed, Hopeless 0  PHQ - 2 Score 3  Altered sleeping 3  Tired, decreased energy 1  Change in appetite 0  Feeling bad or failure about yourself  0  Trouble concentrating 0  Moving slowly or fidgety/restless 0  Suicidal thoughts 0  PHQ-9 Score 7      Review of Systems  Gastrointestinal: Positive for nausea and constipation.  Musculoskeletal: Positive for gait problem.  Neurological: Positive for weakness and numbness.       Spasms  Hematological: Bruises/bleeds easily.  Psychiatric/Behavioral: Positive for dysphoric mood. The patient is nervous/anxious.   All other systems reviewed and are negative.      Objective:   Physical Exam        Assessment & Plan:

## 2015-03-22 NOTE — Patient Instructions (Addendum)
Start walking 10min a day, increase by 5 minutes per week with goal of 30 minutes a day May also walk twice a day

## 2015-03-22 NOTE — Progress Notes (Signed)
Subjective:    Patient ID: [name], female    DOB: 1946/02/19, 69 y.o.   MRN: [id number]  HPI [id number] year old female with history lumbar spinal stenosis has been on chronic pain medications for number of years. She has mainly axial pain, no significant neurogenic claudication type symptoms. She has been stable on her pain regimen which consists of hydrocodone 5/325 one tablet every 8 hours. She also has polymyalgia rheumatica and is on chronic prednisone for this. This is mainly at a 5 mg dose prescribed by her rheumatologist but occasionally has to go up to 10 mg during flareups. 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 She's had problems with low energy sleeping a lot her daughter's concern that this could be medication related. We reviewed her medication list which includes Xanax 0.5 mg twice a day in addition to her hydrocodone 5 mg 3 times a day. She states she has been on Xanax for about 40 years however we did discussed as she ages her Metabolism of medications Slows down  PH Q 9 score last month was 7, Primary care physician has her on the Effexor.  Review of Systems Negative for falls, positive for poor exercise tolerance as well as reduced energy, denies depression    Objective:   Physical Exam  Constitutional: She is oriented to person, place, and time. She appears well-developed and well-nourished.  HENT:  Head: Normocephalic and atraumatic.  Eyes: Conjunctivae and EOM are normal. Pupils are equal, round, and reactive to light.  Neck: Normal range of motion.  Musculoskeletal:       Right shoulder: She exhibits normal range of motion and no pain.       Left shoulder: She exhibits normal range of motion and no pain.  Neurological: She is alert and oriented to person, place, and time. She displays no tremor. Gait abnormal. Coordination normal.  Motor strength of 5/5  bilateral deltoid, biceps, triceps, grip, hip flexor, knee extensor, ankle dorsiflexor and plantar flexor  Forward flexed posture during gait no evidence of toe drag or knee instability.   Psychiatric: She has a normal mood and affect.  Nursing note and vitals reviewed.         Assessment & Plan:  1. History of lumbar spinal stenosis:  No neurogenic claudication at the current time I have encouraged her to start ambulating more about 10 minutes per day and increase by 5 minutes per week. She could also break up her walking sessions into twice a day shorter walks. Her daughter is encouraging her to do this as well Refilled: Hydrocodone 5/325mg  one tablet every 8 hours #95 , Doing well from pain standpoint. 2. Recent left occipital infarct, history of factor V 5 Leiden deficiency: On Plavix and warfarin. Continue to monitor   3. Fibromyalgia syndrome: Continue with Effexor: She has been encouraged to continue to increase her activity. Effexor has been helpful for her overall body aches 4. Polymyalgia rheumatica followup with rheumatology taking prednisone low dose Overall fatigue this certainly could be part of fibromyalgia however I do agree that Xanax should be tapered off. We discussed this may be difficult given her 40 year history of taking this but she does only take this twice a day at a 0.5 mg dose. Daughter who is a nurse will help taper this off and I suggested starting out by having the tablets and then working down from there  Nurse practitioner visit about 6 weeks and she should have about 6 weeks of meds left, pill count 49 today with addnl Rx for 95 tabs today  Continue opioid monitoring program. This consists of regular clinic visits, examinations, urine drug screen, pill counts as well as use of [place] controlled substance reporting System.

## 2015-04-29 ENCOUNTER — Encounter: Payer: Medicare Other | Attending: Physical Medicine & Rehabilitation | Admitting: Registered Nurse

## 2015-04-29 ENCOUNTER — Encounter: Payer: Self-pay | Admitting: Registered Nurse

## 2015-04-29 VITALS — BP 139/49 | HR 55 | Resp 18

## 2015-04-29 DIAGNOSIS — M4806 Spinal stenosis, lumbar region: Secondary | ICD-10-CM

## 2015-04-29 DIAGNOSIS — G894 Chronic pain syndrome: Secondary | ICD-10-CM

## 2015-04-29 DIAGNOSIS — M353 Polymyalgia rheumatica: Secondary | ICD-10-CM

## 2015-04-29 DIAGNOSIS — Z79899 Other long term (current) drug therapy: Secondary | ICD-10-CM | POA: Diagnosis not present

## 2015-04-29 DIAGNOSIS — Z5181 Encounter for therapeutic drug level monitoring: Secondary | ICD-10-CM

## 2015-04-29 DIAGNOSIS — M48061 Spinal stenosis, lumbar region without neurogenic claudication: Secondary | ICD-10-CM

## 2015-04-29 MED ORDER — HYDROCODONE-ACETAMINOPHEN 5-325 MG PO TABS: 1.0000 | ORAL_TABLET | Freq: Three times a day (TID) | ORAL | Status: DC | PRN

## 2015-04-29 NOTE — Progress Notes (Signed)
Subjective:    Patient ID: [name], female    DOB: [birth year], 69 y.o.   MRN: [id number]  HPI: [name] is a 69 year old female who returns for follow up for chronic pain and medication refill.  Her pain  is located in her lower back. She rates her pain 7. Her current exercise regime is walking. She has decreased her xanax. Her daughter assisted [name] weaning her  Effexor she felt like a "Zombie". Her tingling has returned she's thinking about resuming the Effexor, her daughter will call the office. Also encouraged to continue to taper Xanax as tolerated she verbalizes understanding.  Daughter in room all questions answered.  Pain Inventory Average Pain 5 Pain Right Now 7 My pain is constant, sharp, dull and aching  In the last 24 hours, has pain interfered with the following? General activity 6 Relation with others 5 Enjoyment of life 5 What TIME of day is your pain at its worst? all Sleep (in general) NA  Pain is worse with: walking, bending, sitting and standing Pain improves with: heat/ice, pacing activities and medication Relief from Meds: did not answer  Mobility walk without assistance ability to climb steps?  yes do you drive?  yes  Function retired I need assistance with the following:  household duties and shopping  Neuro/Psych bladder control problems weakness numbness tingling trouble walking spasms anxiety  Prior Studies Any changes since last visit?  no  Physicians involved in your care Any changes since last visit?  no   Family History  Problem Relation Age of Onset  . Heart disease Father   . Factor V Leiden deficiency Father   . Factor V Leiden deficiency Brother   . Factor V Leiden deficiency Daughter    History   Social History  . Marital Status: Married    Spouse Name: N/A  . Number of Children: N/A  . Years of Education: N/A   Social History Main Topics  . Smoking status: Current Some Day Smoker  .  Smokeless tobacco: Never Used     Comment: Smokes e cigarettes  . Alcohol Use: No  . Drug Use: Not on file  . Sexual Activity: Not on file   Other Topics Concern  . None   Social History Narrative   Past Surgical History  Procedure Laterality Date  . Cataract extraction    . Cholecystectomy    . Abdominal hysterectomy    . Breast surgery     Past Medical History  Diagnosis Date  . Hypertension   . Arthritis   . Cataract   . COPD (chronic obstructive pulmonary disease)   . Factor 5 Leiden mutation, heterozygous   . Myocardial infarction    BP 139/49 mmHg  Pulse 55  Resp 18  SpO2 99%  Opioid Risk Score:   Fall Risk Score: Moderate Fall Risk (6-13 points) (has been educated and given handout)`1  Depression screen PHQ 2/9  Depression screen PHQ 2/9 02/18/2015  Decreased Interest 3  Down, Depressed, Hopeless 0  PHQ - 2 Score 3  Altered sleeping 3  Tired, decreased energy 1  Change in appetite 0  Feeling bad or failure about yourself  0  Trouble concentrating 0  Moving slowly or fidgety/restless 0  Suicidal thoughts 0  PHQ-9 Score 7     Review of Systems  Musculoskeletal: Positive for gait problem.       Spasms  Neurological: Positive for numbness.       Tingling  Hematological: Bruises/bleeds easily.  Psychiatric/Behavioral: The patient is nervous/anxious.   All other systems reviewed and are negative.      Objective:   Physical Exam  Constitutional: She is oriented to person, place, and time. She appears well-developed and well-nourished.  HENT:  Head: Normocephalic and atraumatic.  Neck: Normal range of motion. Neck supple.  Cardiovascular: Normal rate and regular rhythm.   Pulmonary/Chest: Effort normal and breath sounds normal.  Musculoskeletal:  Normal Muscle Bulk and Muscle Testing Reveals: Upper Extremities: Full ROM and Muscle Strength 5/5 Back without spinal or Paraspinal Tenderness Lower Extremities: Full ROM and Muscle Strength  5/5 Arises from chair with ease Narrow Based Gait  Neurological: She is alert and oriented to person, place, and time.  Skin: Skin is warm and dry.  Psychiatric: She has a normal mood and affect.  Nursing note and vitals reviewed.         Assessment & Plan:  1. History of lumbar spinal stenosis:  Refilled: Hydrocodone 5/325mg  one tablet every 8 hours #95  2. Recent left occipital infarct, history of factor V 5 Leiden deficiency: On Plavix. Continue to monitor  3. Fibromyalgia syndrome: Continue with exercise regime.  4. Polymyalgia rheumatica followup with rheumatology  5. Chronic pain syndrome: Continue with exercise, heat, and ice therapy.  15 minutes of face to face patient care time was spent during this visit. All questions were encouraged and answered.  F/U in 1 month

## 2015-05-02 ENCOUNTER — Other Ambulatory Visit: Payer: Self-pay | Admitting: Registered Nurse

## 2015-05-03 LAB — PMP ALCOHOL METABOLITE (ETG): ETGU: NEGATIVE ng/mL

## 2015-05-05 LAB — BENZODIAZEPINES (GC/LC/MS), URINE
Alprazolam metabolite (GC/LC/MS), ur confirm: 556 ng/mL — AB (ref <25)
CLONAZEPAU: NEGATIVE ng/mL (ref <25)
Flurazepam metabolite (GC/LC/MS), ur confirm: NEGATIVE ng/mL (ref <50)
Lorazepam (GC/LC/MS), ur confirm: NEGATIVE ng/mL (ref <50)
Midazolam (GC/LC/MS), ur confirm: NEGATIVE ng/mL (ref <50)
Nordiazepam (GC/LC/MS), ur confirm: NEGATIVE ng/mL (ref <50)
Oxazepam (GC/LC/MS), ur confirm: NEGATIVE ng/mL (ref <50)
TRIAZOLAMU: NEGATIVE ng/mL (ref <50)
Temazepam (GC/LC/MS), ur confirm: NEGATIVE ng/mL (ref <50)

## 2015-05-05 LAB — OPIATES/OPIOIDS (LC/MS-MS)
CODEINE URINE: NEGATIVE ng/mL (ref <50)
HYDROMORPHONE: 576 ng/mL (ref <50)
Hydrocodone: 2323 ng/mL (ref <50)
Morphine Urine: NEGATIVE ng/mL (ref <50)
NOROXYCODONE, UR: NEGATIVE ng/mL (ref <50)
Norhydrocodone, Ur: 3480 ng/mL (ref <50)
Oxycodone, ur: NEGATIVE ng/mL (ref <50)
Oxymorphone: NEGATIVE ng/mL (ref <50)

## 2015-05-05 LAB — PRESCRIPTION MONITORING PROFILE (SOLSTAS)
BARBITURATE SCREEN, URINE: NEGATIVE ng/mL
BUPRENORPHINE, URINE: NEGATIVE ng/mL
CANNABINOID SCRN UR: NEGATIVE ng/mL
Carisoprodol, Urine: NEGATIVE ng/mL
Cocaine Metabolites: NEGATIVE ng/mL
Creatinine, Urine: 173.38 mg/dL (ref >20.0)
FENTANYL URINE: NEGATIVE ng/mL
MDMA URINE: NEGATIVE ng/mL
MEPERIDINE UR: NEGATIVE ng/mL
Methadone Screen, Urine: NEGATIVE ng/mL
Nitrites, Initial: NEGATIVE ug/mL
Oxycodone Screen, Ur: NEGATIVE ng/mL
Propoxyphene: NEGATIVE ng/mL
TAPENTADOLUR: NEGATIVE ng/mL
Tramadol Scrn, Ur: NEGATIVE ng/mL
Zolpidem, Urine: NEGATIVE ng/mL
pH, Initial: 5.9 pH (ref 4.5–8.9)

## 2015-05-05 LAB — AMPHETAMINES (GC/LC/MS), URINE
AMPHETAMINE CONF, UR: NEGATIVE ng/mL (ref <250)
Methamphetamine Quant, Ur: NEGATIVE ng/mL (ref <250)

## 2015-05-18 NOTE — Progress Notes (Signed)
Urine drug screen for this encounter is consistent for prescribed medication 

## 2015-06-06 ENCOUNTER — Encounter: Payer: Medicare Other | Attending: Physical Medicine & Rehabilitation | Admitting: Registered Nurse

## 2015-06-06 ENCOUNTER — Encounter: Payer: Self-pay | Admitting: Registered Nurse

## 2015-06-06 VITALS — BP 130/85 | HR 63 | Resp 16

## 2015-06-06 DIAGNOSIS — Z79899 Other long term (current) drug therapy: Secondary | ICD-10-CM

## 2015-06-06 DIAGNOSIS — G894 Chronic pain syndrome: Secondary | ICD-10-CM | POA: Diagnosis present

## 2015-06-06 DIAGNOSIS — Z5181 Encounter for therapeutic drug level monitoring: Secondary | ICD-10-CM | POA: Diagnosis not present

## 2015-06-06 DIAGNOSIS — M4806 Spinal stenosis, lumbar region: Secondary | ICD-10-CM

## 2015-06-06 DIAGNOSIS — M353 Polymyalgia rheumatica: Secondary | ICD-10-CM | POA: Diagnosis not present

## 2015-06-06 DIAGNOSIS — M48061 Spinal stenosis, lumbar region without neurogenic claudication: Secondary | ICD-10-CM

## 2015-06-06 MED ORDER — VENLAFAXINE HCL 25 MG PO TABS: ORAL_TABLET | ORAL | Status: DC

## 2015-06-06 MED ORDER — HYDROCODONE-ACETAMINOPHEN 5-325 MG PO TABS: 1.0000 | ORAL_TABLET | Freq: Three times a day (TID) | ORAL | Status: DC | PRN

## 2015-06-06 NOTE — Progress Notes (Signed)
Subjective:    Patient ID: [name], female    DOB: [birth year], 69 y.o.   MRN: [id number]  HPI: [name] is a 69 year old female who returns for follow up for chronic pain and medication refill. Her pain is located in her lower back. Also states she has abdominal cramping denies diarrhea states she has GI Motility.She rates her pain 6. Her current exercise regime is walking short distances and using light weight's for upper extremities.  Daughter in room all questions answered.  Pain Inventory Average Pain 6 Pain Right Now 6 My pain is sharp, dull, tingling and aching  In the last 24 hours, has pain interfered with the following? General activity 6 Relation with others 5 Enjoyment of life 5 What TIME of day is your pain at its worst? morning and evening Sleep (in general) Fair  Pain is worse with: walking, bending, sitting, standing and some activites Pain improves with: rest, heat/ice and medication Relief from Meds: 6  Mobility walk without assistance  Function I need assistance with the following:  shopping  Neuro/Psych bladder control problems weakness numbness tingling spasms anxiety  Prior Studies Any changes since last visit?  no  Physicians involved in your care Any changes since last visit?  no   Family History  Problem Relation Age of Onset  . Heart disease Father   . Factor V Leiden deficiency Father   . Factor V Leiden deficiency Brother   . Factor V Leiden deficiency Daughter    History   Social History  . Marital Status: Married    Spouse Name: N/A  . Number of Children: N/A  . Years of Education: N/A   Social History Main Topics  . Smoking status: Current Some Day Smoker  . Smokeless tobacco: Never Used     Comment: Smokes e cigarettes  . Alcohol Use: No  . Drug Use: Not on file  . Sexual Activity: Not on file   Other Topics Concern  . None   Social History Narrative   Past Surgical History  Procedure  Laterality Date  . Cataract extraction    . Cholecystectomy    . Abdominal hysterectomy    . Breast surgery     Past Medical History  Diagnosis Date  . Hypertension   . Arthritis   . Cataract   . COPD (chronic obstructive pulmonary disease)   . Factor 5 Leiden mutation, heterozygous   . Myocardial infarction    BP 130/85 mmHg  Pulse 63  Resp 16  SpO2 91%  Opioid Risk Score:   Fall Risk Score: Moderate Fall Risk (6-13 points) (previously educated and given handout)`1  Depression screen PHQ 2/9  Depression screen PHQ 2/9 02/18/2015  Decreased Interest 3  Down, Depressed, Hopeless 0  PHQ - 2 Score 3  Altered sleeping 3  Tired, decreased energy 1  Change in appetite 0  Feeling bad or failure about yourself  0  Trouble concentrating 0  Moving slowly or fidgety/restless 0  Suicidal thoughts 0  PHQ-9 Score 7     Review of Systems  Respiratory: Positive for shortness of breath and wheezing.   Gastrointestinal: Positive for nausea, abdominal pain and constipation.  Musculoskeletal:       Spasms  Neurological: Positive for weakness and numbness.       Tingling  Hematological: Bruises/bleeds easily.  Psychiatric/Behavioral: The patient is nervous/anxious.   All other systems reviewed and are negative.      Objective:   Physical  Exam  Constitutional: She is oriented to person, place, and time. She appears well-developed and well-nourished.  HENT:  Head: Normocephalic and atraumatic.  Neck: Normal range of motion. Neck supple.  Cardiovascular: Normal rate and regular rhythm.   Pulmonary/Chest: Effort normal and breath sounds normal.  Musculoskeletal:  Normal Muscle Bulk and Muscle Testing Reveals: Upper Extremities: Full ROM and Muscle Strength 5/5 Lumbar Paraspinal Tenderness: L-3- L-5 Lower Extremities: Full ROM and Muscle Strength 5/5 Arises from chair with ease Narrow Based gait  Neurological: She is alert and oriented to person, place, and time.  Skin: Skin  is warm and dry.  Psychiatric: She has a normal mood and affect.          Assessment & Plan:  1. History of lumbar spinal stenosis:  Refilled: Hydrocodone 5/325mg  one tablet every 8 hours #95  2. Recent left occipital infarct, history of factor V 5 Leiden deficiency: On Plavix. Continue to monitor  3. Fibromyalgia syndrome: Continue with exercise regime.  4. Polymyalgia rheumatica followup with rheumatology  5. Chronic pain syndrome: Continue with exercise, heat, and ice therapy.  15 minutes of face to face patient care time was spent during this visit. All questions were encouraged and answered.  F/U in 1 month

## 2015-07-04 ENCOUNTER — Encounter: Payer: Self-pay | Admitting: Registered Nurse

## 2015-07-04 ENCOUNTER — Encounter: Payer: Medicare Other | Attending: Physical Medicine & Rehabilitation | Admitting: Registered Nurse

## 2015-07-04 VITALS — BP 140/63 | HR 62

## 2015-07-04 DIAGNOSIS — F17200 Nicotine dependence, unspecified, uncomplicated: Secondary | ICD-10-CM

## 2015-07-04 DIAGNOSIS — Z79899 Other long term (current) drug therapy: Secondary | ICD-10-CM | POA: Diagnosis not present

## 2015-07-04 DIAGNOSIS — G894 Chronic pain syndrome: Secondary | ICD-10-CM | POA: Diagnosis not present

## 2015-07-04 DIAGNOSIS — F17290 Nicotine dependence, other tobacco product, uncomplicated: Secondary | ICD-10-CM

## 2015-07-04 DIAGNOSIS — M48061 Spinal stenosis, lumbar region without neurogenic claudication: Secondary | ICD-10-CM

## 2015-07-04 DIAGNOSIS — M4806 Spinal stenosis, lumbar region: Secondary | ICD-10-CM | POA: Diagnosis not present

## 2015-07-04 DIAGNOSIS — M353 Polymyalgia rheumatica: Secondary | ICD-10-CM | POA: Insufficient documentation

## 2015-07-04 DIAGNOSIS — Z5181 Encounter for therapeutic drug level monitoring: Secondary | ICD-10-CM | POA: Insufficient documentation

## 2015-07-04 MED ORDER — HYDROCODONE-ACETAMINOPHEN 5-325 MG PO TABS: 1.0000 | ORAL_TABLET | Freq: Three times a day (TID) | ORAL | Status: DC | PRN

## 2015-07-11 NOTE — Progress Notes (Signed)
Subjective:    Patient ID: [name], female    DOB: 1946/04/04, 69 y.o.   MRN: [id number]  HPI: [name] is a 69 year old female who returns for follow up for chronic pain and medication refill. Her pain is located in her lower back.She rates her pain 6. Her current exercise regime is walking short distances and occassionally using light weight's for upper extremities.Arrived to office hypoxic o2 saturation 89% placed her oxygen on 3 liters nasal cannula o2 sat's up to 95%. [name] admits to smoking 1/2 ppd encouraged smoking cessation she verbalizes understanding. She will keep her oxygen on she verbalizes understanding.   Daughter in room all questions answered.   Pain Inventory Average Pain 6 Pain Right Now 6 My pain is constant, dull, tingling and aching  In the last 24 hours, has pain interfered with the following? General activity 4 Relation with others 3 Enjoyment of life 4 What TIME of day is your pain at its worst? morning Sleep (in general) NA  Pain is worse with: walking and bending Pain improves with: heat/ice, pacing activities and medication Relief from Meds: not answered  Mobility walk without assistance ability to climb steps?  yes do you drive?  yes  Function I need assistance with the following:  meal prep, household duties and shopping  Neuro/Psych bladder control problems weakness numbness tingling spasms anxiety  Prior Studies Any changes since last visit?  no  Physicians involved in your care Any changes since last visit?  no   Family History  Problem Relation Age of Onset  . Heart disease Father   . Factor V Leiden deficiency Father   . Factor V Leiden deficiency Brother   . Factor V Leiden deficiency Daughter    Social History   Social History  . Marital Status: Married    Spouse Name: N/A  . Number of Children: N/A  . Years of Education: N/A   Social History Main Topics  . Smoking status: Current  Some Day Smoker  . Smokeless tobacco: Never Used     Comment: Smokes e cigarettes  . Alcohol Use: No  . Drug Use: None  . Sexual Activity: Not Asked   Other Topics Concern  . None   Social History Narrative   Past Surgical History  Procedure Laterality Date  . Cataract extraction    . Cholecystectomy    . Abdominal hysterectomy    . Breast surgery     Past Medical History  Diagnosis Date  . Hypertension   . Arthritis   . Cataract   . COPD (chronic obstructive pulmonary disease)   . Factor 5 Leiden mutation, heterozygous   . Myocardial infarction    BP 140/63 mmHg  Pulse 62  SpO2 97%  Opioid Risk Score:   Fall Risk Score:  `1  Depression screen PHQ 2/9  Depression screen [facility] 2/9 07/11/2015 02/18/2015  Decreased Interest 3 3  Down, Depressed, Hopeless 0 0  PHQ - 2 Score 3 3  Altered sleeping - 3  Tired, decreased energy - 1  Change in appetite - 0  Feeling bad or failure about yourself  - 0  Trouble concentrating - 0  Moving slowly or fidgety/restless - 0  Suicidal thoughts - 0  PHQ-9 Score - 7     Review of Systems  Respiratory: Positive for shortness of breath.   Gastrointestinal: Positive for constipation.  Genitourinary:       Bladder control problems  Musculoskeletal:  Spasms  Neurological: Positive for weakness and numbness.       Tingling  Psychiatric/Behavioral: The patient is nervous/anxious.   All other systems reviewed and are negative.      Objective:   Physical Exam  Constitutional: She is oriented to person, place, and time. She appears well-developed and well-nourished.  HENT:  Head: Normocephalic and atraumatic.  Neck: Normal range of motion. Neck supple.  Cardiovascular: Normal rate and regular rhythm.   Pulmonary/Chest: Effort normal and breath sounds normal.  Continuous oxygen 3 liters nasal cannula  Musculoskeletal:  Normal Muscle Bulk and Muscle Testing Reveals: Upper extremities: Full ROM and Muscle Strength 5/5 Back  without spinal or paraspinal tenderness Lower Extremities: Full ROM and Muscle Strength 5/5 Arises from chair with ease Narrow Based Gait  Neurological: She is alert and oriented to person, place, and time.  Skin: Skin is warm and dry.  Psychiatric: She has a normal mood and affect.  Nursing note and vitals reviewed.         Assessment & Plan:  1. History of lumbar spinal stenosis:  Refilled: Hydrocodone 5/325mg  one tablet every 8 hours #95  2. Recent left occipital infarct, history of factor V 5 Leiden deficiency: On Plavix. Continue to monitor  3. Fibromyalgia syndrome: Continue with exercise regime.  4. Polymyalgia rheumatica followup with rheumatology  5. Chronic pain syndrome: Continue with exercise, heat, and ice therapy. 6. Nicotine Dependence: RX: Smoking Cessation 7. Hypoxia: Oxygen Placed 3 liters nasal cannula  25 minutes of face to face patient care time was spent during this visit. All questions were encouraged and answered.  F/U in 1 month

## 2015-08-08 ENCOUNTER — Encounter: Payer: Self-pay | Admitting: Registered Nurse

## 2015-08-08 ENCOUNTER — Encounter: Payer: Medicare Other | Attending: Physical Medicine & Rehabilitation | Admitting: Registered Nurse

## 2015-08-08 VITALS — BP 121/50 | HR 95

## 2015-08-08 DIAGNOSIS — M48061 Spinal stenosis, lumbar region without neurogenic claudication: Secondary | ICD-10-CM

## 2015-08-08 DIAGNOSIS — Z79899 Other long term (current) drug therapy: Secondary | ICD-10-CM

## 2015-08-08 DIAGNOSIS — M4806 Spinal stenosis, lumbar region: Secondary | ICD-10-CM | POA: Insufficient documentation

## 2015-08-08 DIAGNOSIS — M353 Polymyalgia rheumatica: Secondary | ICD-10-CM | POA: Insufficient documentation

## 2015-08-08 DIAGNOSIS — G894 Chronic pain syndrome: Secondary | ICD-10-CM | POA: Insufficient documentation

## 2015-08-08 DIAGNOSIS — M542 Cervicalgia: Secondary | ICD-10-CM

## 2015-08-08 DIAGNOSIS — Z5181 Encounter for therapeutic drug level monitoring: Secondary | ICD-10-CM | POA: Insufficient documentation

## 2015-08-08 MED ORDER — HYDROCODONE-ACETAMINOPHEN 5-325 MG PO TABS: 1.0000 | ORAL_TABLET | Freq: Three times a day (TID) | ORAL | Status: DC | PRN

## 2015-08-08 NOTE — Progress Notes (Signed)
Subjective:    Patient ID: [name], female    DOB: 11/18/1946, 69 y.o.   MRN: [id number]  HPI: [name] is a 69 year old female who returns for follow up for chronic pain and medication refill. Her pain is located in her neck and lower back.She rates her pain 6. Her current exercise regime is walking short distances in her home, living sedentary lifestyle. Encouraged to increase activity she verbalizes understanding..Mrs. Nelligan still smoking 1/2 ppd encouraged again  smoking cessation she verbalizes understanding.  Her nephew passed away emotional support given.  Daughter in room all questions answered.  Pain Inventory Average Pain 6 Pain Right Now 6 My pain is constant, sharp, stabbing and aching  In the last 24 hours, has pain interfered with the following? General activity 6 Relation with others 4 Enjoyment of life 4 What TIME of day is your pain at its worst? morning, evening Sleep (in general) Fair  Pain is worse with: walking, bending, sitting and standing Pain improves with: heat/ice, pacing activities and medication Relief from Meds: 8  Mobility walk without assistance ability to climb steps?  yes do you drive?  yes transfers alone Do you have any goals in this area?  yes  Function retired I need assistance with the following:  meal prep, household duties and shopping  Neuro/Psych bladder control problems weakness numbness tingling trouble walking spasms anxiety  Prior Studies Any changes since last visit?  no  Physicians involved in your care Any changes since last visit?  no   Family History  Problem Relation Age of Onset  . Heart disease Father   . Factor V Leiden deficiency Father   . Factor V Leiden deficiency Brother   . Factor V Leiden deficiency Daughter    Social History   Social History  . Marital Status: Married    Spouse Name: N/A  . Number of Children: N/A  . Years of Education: N/A   Social History  Main Topics  . Smoking status: Current Some Day Smoker  . Smokeless tobacco: Never Used     Comment: Smokes e cigarettes  . Alcohol Use: No  . Drug Use: None  . Sexual Activity: Not Asked   Other Topics Concern  . None   Social History Narrative   Past Surgical History  Procedure Laterality Date  . Cataract extraction    . Cholecystectomy    . Abdominal hysterectomy    . Breast surgery     Past Medical History  Diagnosis Date  . Hypertension   . Arthritis   . Cataract   . COPD (chronic obstructive pulmonary disease)   . Factor 5 Leiden mutation, heterozygous   . Myocardial infarction    BP 121/50 mmHg  Pulse 95  SpO2 95%  Opioid Risk Score:   Fall Risk Score:  `1  Depression screen PHQ 2/9  Depression screen [facility] 2/9 08/08/2015 07/11/2015 02/18/2015  Decreased Interest Down, Depressed, Hopeless 3 0 0  PHQ - 2 Score Altered sleeping - - 3  Tired, decreased energy - - 1  Change in appetite - - 0  Feeling bad or failure about yourself  - - 0  Trouble concentrating - - 0  Moving slowly or fidgety/restless - - 0  Suicidal thoughts - - 0  PHQ-9 Score - - 7     Review of Systems  Constitutional: Positive for unexpected weight change.  Respiratory: Positive for shortness of breath  and wheezing.   Gastrointestinal: Positive for constipation.       Easy bleeding  All other systems reviewed and are negative.      Objective:   Physical Exam  Constitutional: She is oriented to person, place, and time. She appears well-developed and well-nourished.  HENT:  Head: Normocephalic and atraumatic.  Neck: Normal range of motion. Neck supple.  Cervical Paraspinal Tenderness: C-5- C-6  Cardiovascular: Normal rate and regular rhythm.   Pulmonary/Chest: Effort normal and breath sounds normal.  Musculoskeletal:  Normal Muscle Bulk and Muscle Testing Reveals: Upper Extremities: Full ROM and Muscle Strength 5/5 Lumbar Paraspinal Tenderness: L-3-L-4 Lower  Extremities: Full ROM and Muscle Strength Venous Stasis Noted to Bilateral Feet Arises from chair with ease Narrow Based Gait.  Neurological: She is alert and oriented to person, place, and time.  Skin: Skin is warm and dry.  Psychiatric: She has a normal mood and affect.  Nursing note and vitals reviewed.         Assessment & Plan:  1. History of lumbar spinal stenosis:  Refilled: Hydrocodone 5/325mg  one tablet every 8 hours #95  2. Recent left occipital infarct, history of factor V 5 Leiden deficiency: On Plavix. Continue to monitor  3. Fibromyalgia syndrome: Continue with exercise regime.  4. Polymyalgia rheumatica followup with rheumatology  5. Chronic pain syndrome: Continue with exercise, heat, and ice therapy. 6. Nicotine Dependence: Encouraged Smoking Cessation   25 minutes of face to face patient care time was spent during this visit. All questions were encouraged and answered.   F/U in 1 month

## 2015-09-05 ENCOUNTER — Encounter: Payer: Medicare Other | Attending: Physical Medicine & Rehabilitation | Admitting: Registered Nurse

## 2015-09-05 ENCOUNTER — Encounter: Payer: Self-pay | Admitting: Registered Nurse

## 2015-09-05 VITALS — BP 134/56 | HR 84

## 2015-09-05 DIAGNOSIS — Z79899 Other long term (current) drug therapy: Secondary | ICD-10-CM | POA: Insufficient documentation

## 2015-09-05 DIAGNOSIS — M353 Polymyalgia rheumatica: Secondary | ICD-10-CM | POA: Diagnosis not present

## 2015-09-05 DIAGNOSIS — M48061 Spinal stenosis, lumbar region without neurogenic claudication: Secondary | ICD-10-CM

## 2015-09-05 DIAGNOSIS — M7061 Trochanteric bursitis, right hip: Secondary | ICD-10-CM

## 2015-09-05 DIAGNOSIS — G894 Chronic pain syndrome: Secondary | ICD-10-CM | POA: Diagnosis not present

## 2015-09-05 DIAGNOSIS — Z5181 Encounter for therapeutic drug level monitoring: Secondary | ICD-10-CM | POA: Insufficient documentation

## 2015-09-05 DIAGNOSIS — M4806 Spinal stenosis, lumbar region: Secondary | ICD-10-CM | POA: Insufficient documentation

## 2015-09-05 MED ORDER — HYDROCODONE-ACETAMINOPHEN 5-325 MG PO TABS: 1.0000 | ORAL_TABLET | Freq: Three times a day (TID) | ORAL | Status: DC | PRN

## 2015-09-05 NOTE — Progress Notes (Signed)
Subjective:    Patient ID: [name], female    DOB: [birth year], 69 y.o.   MRN: [id number]  HPI: [name] is a 69 year old female who returns for follow up for chronic pain and medication refill. Her pain is located in her right hip.She rates her pain 3. Her current exercise regime is walking short distances in her home and going for walks with her daughter.[name] has decreased her smoking to 5 cigarettes a day  Encouraged to continue with thesmoking cessation she verbalizes understanding.  Daughter in room all questions answered.  Pain Inventory Average Pain 5 Pain Right Now 3 My pain is intermittent, sharp, burning, dull, tingling and aching  In the last 24 hours, has pain interfered with the following? General activity 5 Relation with others 3 Enjoyment of life 4 What TIME of day is your pain at its worst? morning and evening Sleep (in general) Fair  Pain is worse with: walking, standing and some activites Pain improves with: rest, heat/ice, pacing activities and medication Relief from Meds: 7  Mobility walk without assistance how many minutes can you walk? 30 ability to climb steps?  yes do you drive?  yes Do you have any goals in this area?  yes  Function retired I need assistance with the following:  meal prep, household duties and shopping Do you have any goals in this area?  no  Neuro/Psych bladder control problems weakness numbness tingling spasms anxiety  Prior Studies Any changes since last visit?  no  Physicians involved in your care Any changes since last visit?  no   Family History  Problem Relation Age of Onset  . Heart disease Father   . Factor V Leiden deficiency Father   . Factor V Leiden deficiency Brother   . Factor V Leiden deficiency Daughter    Social History   Social History  . Marital Status: Married    Spouse Name: N/A  . Number of Children: N/A  . Years of Education: N/A   Social History Main  Topics  . Smoking status: Current Some Day Smoker  . Smokeless tobacco: Never Used     Comment: Smokes e cigarettes  . Alcohol Use: No  . Drug Use: None  . Sexual Activity: Not Asked   Other Topics Concern  . None   Social History Narrative   Past Surgical History  Procedure Laterality Date  . Cataract extraction    . Cholecystectomy    . Abdominal hysterectomy    . Breast surgery     Past Medical History  Diagnosis Date  . Hypertension   . Arthritis   . Cataract   . COPD (chronic obstructive pulmonary disease) (HCC)   . Factor 5 Leiden mutation, heterozygous (HCC)   . Myocardial infarction (HCC)    BP 134/56 mmHg  Pulse 84  SpO2 98%  Opioid Risk Score:   Fall Risk Score:  `1  Depression screen PHQ 2/9  Depression screen [facility] 2/9 08/08/2015 07/11/2015 02/18/2015  Decreased Interest Down, Depressed, Hopeless 3 0 0  PHQ - 2 Score Altered sleeping - - 3  Tired, decreased energy - - 1  Change in appetite - - 0  Feeling bad or failure about yourself  - - 0  Trouble concentrating - - 0  Moving slowly or fidgety/restless - - 0  Suicidal thoughts - - 0  PHQ-9 Score - - 7     Review of Systems  Respiratory: Positive for shortness of breath and wheezing.   Gastrointestinal: Positive for constipation.  Hematological: Bruises/bleeds easily.  All other systems reviewed and are negative.      Objective:   Physical Exam  Constitutional: She is oriented to person, place, and time. She appears well-developed and well-nourished.  HENT:  Head: Normocephalic and atraumatic.  Neck: Normal range of motion. Neck supple.  Cardiovascular: Normal rate.   Pulmonary/Chest: Effort normal and breath sounds normal.  Musculoskeletal:  Normal Muscle Bulk and Muscle Testing Reveals: Upper Extremities: Full ROM and Muscle Strength 5/5 Right Greater Trochanteric Tenderness Lower Extremities: Full ROM and Muscle Strength 5/5 Arises from chair with ease Narrow Based gait    Neurological: She is alert and oriented to person, place, and time.  Skin: Skin is warm and dry.  Psychiatric: She has a normal mood and affect.  Nursing note and vitals reviewed.         Assessment & Plan:  1. History of lumbar spinal stenosis:  Refilled: Hydrocodone 5/325mg  one tablet every 8 hours #95  2. Recent left occipital infarct, history of factor V 5 Leiden deficiency: On Plavix. Continue to monitor  3. Fibromyalgia syndrome: Continue with exercise regime.  4. Polymyalgia rheumatica followup with rheumatology  5. Chronic pain syndrome: Continue with exercise, heat, and ice therapy. 6. Nicotine Dependence: Encouraged Smoking Cessation 7. Right Greater Trochanteric Bursitis: Schedule Cortisone Injection with Dr. Wynn BankerKirsteins  20 minutes of face to face patient care time was spent during this visit. All questions were encouraged and answered.   F/U in 1 month

## 2015-09-30 ENCOUNTER — Ambulatory Visit: Payer: Medicare Other | Admitting: Physical Medicine & Rehabilitation

## 2015-10-04 ENCOUNTER — Ambulatory Visit (HOSPITAL_BASED_OUTPATIENT_CLINIC_OR_DEPARTMENT_OTHER): Payer: Medicare Other | Admitting: Physical Medicine & Rehabilitation

## 2015-10-04 ENCOUNTER — Encounter: Payer: Self-pay | Admitting: Physical Medicine & Rehabilitation

## 2015-10-04 ENCOUNTER — Encounter: Payer: Medicare Other | Attending: Physical Medicine & Rehabilitation

## 2015-10-04 VITALS — BP 134/53 | HR 61

## 2015-10-04 DIAGNOSIS — M7061 Trochanteric bursitis, right hip: Secondary | ICD-10-CM

## 2015-10-04 DIAGNOSIS — G894 Chronic pain syndrome: Secondary | ICD-10-CM | POA: Diagnosis present

## 2015-10-04 DIAGNOSIS — M353 Polymyalgia rheumatica: Secondary | ICD-10-CM | POA: Diagnosis not present

## 2015-10-04 DIAGNOSIS — M4806 Spinal stenosis, lumbar region: Secondary | ICD-10-CM | POA: Diagnosis not present

## 2015-10-04 DIAGNOSIS — Z79899 Other long term (current) drug therapy: Secondary | ICD-10-CM | POA: Diagnosis not present

## 2015-10-04 DIAGNOSIS — Z5181 Encounter for therapeutic drug level monitoring: Secondary | ICD-10-CM | POA: Diagnosis not present

## 2015-10-04 MED ORDER — HYDROCODONE-ACETAMINOPHEN 5-325 MG PO TABS: 1.0000 | ORAL_TABLET | Freq: Three times a day (TID) | ORAL | Status: DC | PRN

## 2015-10-04 NOTE — Progress Notes (Signed)
Subjective:    Patient ID: [name], female    DOB: [birth year], 69 y.o.   MRN: [id number]  HPI  Pain Inventory Average Pain 5 Pain Right Now 5 My pain is constant, sharp, dull, tingling and aching  In the last 24 hours, has pain interfered with the following? General activity 6 Relation with others 3 Enjoyment of life 5 What TIME of day is your pain at its worst? morning, day, evening Sleep (in general) Good  Pain is worse with: walking, bending, standing and some activites Pain improves with: rest, heat/ice and medication Relief from Meds: 6  Mobility walk without assistance how many minutes can you walk? 1/4 mile ability to climb steps?  yes do you drive?  yes transfers alone Do you have any goals in this area?  yes  Function retired I need assistance with the following:  bathing, meal prep, household duties and shopping Do you have any goals in this area?  yes  Neuro/Psych bladder control problems weakness numbness tingling trouble walking anxiety  Prior Studies Any changes since last visit?  no  Physicians involved in your care Royanne Foots   Family History  Problem Relation Age of Onset  . Heart disease Father   . Factor V Leiden deficiency Father   . Factor V Leiden deficiency Brother   . Factor V Leiden deficiency Daughter    Social History   Social History  . Marital Status: Married    Spouse Name: N/A  . Number of Children: N/A  . Years of Education: N/A   Social History Main Topics  . Smoking status: Current Some Day Smoker  . Smokeless tobacco: Never Used     Comment: Smokes e cigarettes  . Alcohol Use: No  . Drug Use: None  . Sexual Activity: Not Asked   Other Topics Concern  . None   Social History Narrative   Past Surgical History  Procedure Laterality Date  . Cataract extraction    . Cholecystectomy    . Abdominal hysterectomy    . Breast surgery     Past Medical History  Diagnosis Date  . Hypertension   .  Arthritis   . Cataract   . COPD (chronic obstructive pulmonary disease) (HCC)   . Factor 5 Leiden mutation, heterozygous (HCC)   . Myocardial infarction (HCC)    BP 134/53 mmHg  Pulse 61  SpO2 93%  Opioid Risk Score:   Fall Risk Score:  `1  Depression screen PHQ 2/9  Depression screen [facility] 2/9 08/08/2015 07/11/2015 02/18/2015  Decreased Interest Down, Depressed, Hopeless 3 0 0  PHQ - 2 Score Altered sleeping - - 3  Tired, decreased energy - - 1  Change in appetite - - 0  Feeling bad or failure about yourself  - - 0  Trouble concentrating - - 0  Moving slowly or fidgety/restless - - 0  Suicidal thoughts - - 0  PHQ-9 Score - - 7      Review of Systems  Constitutional: Positive for unexpected weight change.  Respiratory: Positive for shortness of breath and wheezing.        Wear oxygen when moving around.  Gastrointestinal: Positive for constipation.  Musculoskeletal: Positive for myalgias, back pain, gait problem and neck pain.  Neurological: Positive for weakness.  All other systems reviewed and are negative.      Objective:   Physical Exam        Assessment & Plan:  RIGHT Trochanteric bursa injection without ultrasound guidance  Indication Trochanteric bursitis. Exam has tenderness over the greater trochanter of the hip. Pain has not responded to conservative care such as exercise therapy and oral medications. Pain interferes with sleep or with mobility Informed consent was obtained after describing risks and benefits of the procedure with the patient these include bleeding bruising and infection. Patient has signed written consent form. Patient placed in a lateral decubitus position with the affected hip superior. Point of maximal pain was palpated marked and prepped with Betadine and entered with a needle to bone contact. Needle slightly withdrawn then 6mg  of betamethasone with 4 cc 1% lidocaine were injected. Patient tolerated procedure well. Post  procedure instructions given.

## 2015-10-04 NOTE — Patient Instructions (Signed)
Right trochanteric bursa injection today. It may take 1-2 days to take full effect. If you have soreness at the injection site or some bruising please ice 15-20 minutes every 2 hours as needed

## 2015-11-08 ENCOUNTER — Encounter: Payer: Self-pay | Admitting: Registered Nurse

## 2015-11-08 ENCOUNTER — Encounter: Payer: Medicare Other | Attending: Physical Medicine & Rehabilitation | Admitting: Registered Nurse

## 2015-11-08 VITALS — BP 140/51 | HR 61

## 2015-11-08 DIAGNOSIS — M4806 Spinal stenosis, lumbar region: Secondary | ICD-10-CM | POA: Diagnosis not present

## 2015-11-08 DIAGNOSIS — G894 Chronic pain syndrome: Secondary | ICD-10-CM | POA: Diagnosis present

## 2015-11-08 DIAGNOSIS — Z79899 Other long term (current) drug therapy: Secondary | ICD-10-CM | POA: Diagnosis not present

## 2015-11-08 DIAGNOSIS — M542 Cervicalgia: Secondary | ICD-10-CM | POA: Diagnosis not present

## 2015-11-08 DIAGNOSIS — Z5181 Encounter for therapeutic drug level monitoring: Secondary | ICD-10-CM | POA: Insufficient documentation

## 2015-11-08 DIAGNOSIS — M48061 Spinal stenosis, lumbar region without neurogenic claudication: Secondary | ICD-10-CM

## 2015-11-08 DIAGNOSIS — M353 Polymyalgia rheumatica: Secondary | ICD-10-CM | POA: Insufficient documentation

## 2015-11-08 MED ORDER — HYDROCODONE-ACETAMINOPHEN 5-325 MG PO TABS: 1.0000 | ORAL_TABLET | Freq: Three times a day (TID) | ORAL | Status: DC | PRN

## 2015-11-08 NOTE — Progress Notes (Signed)
Subjective:    Patient ID: [name], female    DOB: Mar 10, 1946, 69 y.o.   MRN: [id number]  HPI: [name] is a 69 year old female who returns for follow up for chronic pain and medication refill. Her pain is located in her neck..She rates her pain 6. Her current exercise regime is walking short distances in her home and going for walks with her daughter. S/P Right Trochanteric Injection with good relief noted. Daughter in room all questions answered.   Pain Inventory Average Pain 6 Pain Right Now 6 My pain is constant, sharp, dull, tingling and aching  In the last 24 hours, has pain interfered with the following? General activity 5 Relation with others 3 Enjoyment of life 4 What TIME of day is your pain at its worst? daytime Sleep (in general) Fair  Pain is worse with: walking, bending, standing and some activites Pain improves with: rest, heat/ice and pacing activities Relief from Meds: 6  Mobility walk without assistance how many minutes can you walk? 15-30 ability to climb steps?  yes do you drive?  yes  Function retired I need assistance with the following:  meal prep, household duties and shopping  Neuro/Psych bladder control problems bowel control problems weakness numbness tingling trouble walking spasms anxiety  Prior Studies Any changes since last visit?  no  Physicians involved in your care Any changes since last visit?  no   Family History  Problem Relation Age of Onset  . Heart disease Father   . Factor V Leiden deficiency Father   . Factor V Leiden deficiency Brother   . Factor V Leiden deficiency Daughter    Social History   Social History  . Marital Status: Married    Spouse Name: N/A  . Number of Children: N/A  . Years of Education: N/A   Social History Main Topics  . Smoking status: Current Some Day Smoker  . Smokeless tobacco: Never Used     Comment: Smokes e cigarettes  . Alcohol Use: No  . Drug Use: None   . Sexual Activity: Not Asked   Other Topics Concern  . None   Social History Narrative   Past Surgical History  Procedure Laterality Date  . Cataract extraction    . Cholecystectomy    . Abdominal hysterectomy    . Breast surgery     Past Medical History  Diagnosis Date  . Hypertension   . Arthritis   . Cataract   . COPD (chronic obstructive pulmonary disease) (HCC)   . Factor 5 Leiden mutation, heterozygous (HCC)   . Myocardial infarction (HCC)    BP 140/51 mmHg  Pulse 61  SpO2 94%  Opioid Risk Score:   Fall Risk Score:  `1  Depression screen PHQ 2/9  [facility] 08/08/2015 07/11/2015 02/18/2015  Decreased Interest 3 3 3   Down, Depressed, Hopeless 3 0 0  PHQ - 2 Score 6 3 3   Altered sleeping - - 3  Tired, decreased energy - - 1  Change in appetite - - 0  Feeling bad or failure about yourself  - - 0  Trouble concentrating - - 0  Moving slowly or fidgety/restless - - 0  Suicidal thoughts - - 0  PHQ-9 Score - - 7    Review of Systems  Constitutional: Positive for appetite change.  Respiratory: Positive for shortness of breath and wheezing.   Cardiovascular: Positive for leg swelling.  Gastrointestinal: Positive for nausea, abdominal pain and constipation.  Hematological: Bruises/bleeds easily.  All other systems reviewed and are negative.      Objective:   Physical Exam  Constitutional: She is oriented to person, place, and time. She appears well-developed and well-nourished.  HENT:  Head: Normocephalic and atraumatic.  Neck: Normal range of motion. Neck supple.  Cervical Paraspinal Tenderness: C-5- C-6  Cardiovascular: Normal rate and regular rhythm.   Pulmonary/Chest: Effort normal and breath sounds normal.  Arrived with Continuous oxygen 3 liters nasal cannula  Musculoskeletal:  Normal Muscle Bulk and Muscle Testing Reveals: Upper Extremities: Full ROM and Muscle Strength 5/5 Back without spinal or paraspinal tenderness Lower  Extremities: Full ROM and Muscle Strength 5/5 Arises from chair with ease Narrow Based Gait  Neurological: She is alert and oriented to person, place, and time.  Skin: Skin is warm and dry.  Psychiatric: She has a normal mood and affect.  Nursing note and vitals reviewed.         Assessment & Plan:  1. History of lumbar spinal stenosis:  Refilled: Hydrocodone 5/325mg  one tablet every 8 hours #95  2. Recent left occipital infarct, history of factor V 5 Leiden deficiency: On Plavix. Continue to monitor  3. Fibromyalgia syndrome: Continue with exercise regime.  4. Polymyalgia rheumatica followup with rheumatology  5. Chronic pain syndrome: Continue with exercise, heat, and ice therapy. 6. Nicotine Dependence: Encouraged Smoking Cessation  20 minutes of face to face patient care time was spent during this visit. All questions were encouraged and answered.   F/U in 1 month

## 2015-11-09 ENCOUNTER — Other Ambulatory Visit: Payer: Self-pay | Admitting: Registered Nurse

## 2015-12-06 ENCOUNTER — Encounter: Payer: Self-pay | Admitting: Registered Nurse

## 2015-12-06 ENCOUNTER — Encounter: Payer: Medicare Other | Attending: Physical Medicine & Rehabilitation | Admitting: Registered Nurse

## 2015-12-06 VITALS — BP 123/49 | HR 60

## 2015-12-06 DIAGNOSIS — M4806 Spinal stenosis, lumbar region: Secondary | ICD-10-CM | POA: Diagnosis not present

## 2015-12-06 DIAGNOSIS — Z79899 Other long term (current) drug therapy: Secondary | ICD-10-CM | POA: Insufficient documentation

## 2015-12-06 DIAGNOSIS — M353 Polymyalgia rheumatica: Secondary | ICD-10-CM | POA: Insufficient documentation

## 2015-12-06 DIAGNOSIS — G894 Chronic pain syndrome: Secondary | ICD-10-CM | POA: Diagnosis not present

## 2015-12-06 DIAGNOSIS — Z5181 Encounter for therapeutic drug level monitoring: Secondary | ICD-10-CM | POA: Insufficient documentation

## 2015-12-06 DIAGNOSIS — M48061 Spinal stenosis, lumbar region without neurogenic claudication: Secondary | ICD-10-CM

## 2015-12-06 MED ORDER — HYDROCODONE-ACETAMINOPHEN 5-325 MG PO TABS: 1.0000 | ORAL_TABLET | Freq: Three times a day (TID) | ORAL | Status: DC | PRN

## 2015-12-06 NOTE — Progress Notes (Addendum)
Subjective:    Patient ID: [name], female    DOB: [birth year], 70 y.o.   MRN: [id number]  HPI: [name] is a 70 year old female who returns for follow up for chronic pain and medication refill. Her pain usuallyis located in her lower back, today she is pain free.She rated her pain 5 last night. Her current exercise regime is walking short distances in her home. [name] daughter states she has been weaning [name] effexor due to somnolence, right now she is on 1/2 tablet twice a week. Effexor discontinued.  Pain Inventory Average Pain 5 Pain Right Now 5 My pain is constant, sharp, dull, tingling and aching  In the last 24 hours, has pain interfered with the following? General activity 5 Relation with others 3 Enjoyment of life 4 What TIME of day is your pain at its worst? morning and evening Sleep (in general) Fair  Pain is worse with: walking, bending and standing Pain improves with: rest, heat/ice and medication Relief from Meds: 7  Mobility walk without assistance how many minutes can you walk? 30 ability to climb steps?  yes do you drive?  yes  Function retired I need assistance with the following:  meal prep, household duties and shopping  Neuro/Psych bladder control problems weakness numbness tingling trouble walking spasms anxiety  Prior Studies Any changes since last visit?  no  Physicians involved in your care Any changes since last visit?  no   Family History  Problem Relation Age of Onset  . Heart disease Father   . Factor V Leiden deficiency Father   . Factor V Leiden deficiency Brother   . Factor V Leiden deficiency Daughter    Social History   Social History  . Marital Status: Married    Spouse Name: N/A  . Number of Children: N/A  . Years of Education: N/A   Social History Main Topics  . Smoking status: Current Some Day Smoker  . Smokeless tobacco: Never Used     Comment: Smokes e cigarettes  .  Alcohol Use: No  . Drug Use: None  . Sexual Activity: Not Asked   Other Topics Concern  . None   Social History Narrative   Past Surgical History  Procedure Laterality Date  . Cataract extraction    . Cholecystectomy    . Abdominal hysterectomy    . Breast surgery     Past Medical History  Diagnosis Date  . Hypertension   . Arthritis   . Cataract   . COPD (chronic obstructive pulmonary disease) (HCC)   . Factor 5 Leiden mutation, heterozygous (HCC)   . Myocardial infarction (HCC)    BP 123/49 mmHg  Pulse 60  SpO2 91%  Opioid Risk Score:   Fall Risk Score:  `1  Depression screen PHQ 2/9  Depression screen [facility] 2/9 12/06/2015 08/08/2015 07/11/2015 02/18/2015  Decreased Interest Down, Depressed, Hopeless 3 3 0 0  PHQ - 2 Score Altered sleeping - - - 3  Tired, decreased energy - - - 1  Change in appetite - - - 0  Feeling bad or failure about yourself  - - - 0  Trouble concentrating - - - 0  Moving slowly or fidgety/restless - - - 0  Suicidal thoughts - - - 0  PHQ-9 Score - - - 7     Review of Systems  Respiratory: Positive for shortness of breath and wheezing.  Cardiovascular: Positive for leg swelling.  Gastrointestinal: Positive for abdominal pain.  Hematological: Bruises/bleeds easily.  All other systems reviewed and are negative.      Objective:   Physical Exam  Constitutional: She is oriented to person, place, and time. She appears well-developed and well-nourished.  HENT:  Head: Normocephalic and atraumatic.  Neck: Normal range of motion. Neck supple.  Cardiovascular: Normal rate and regular rhythm.   Pulmonary/Chest: Effort normal and breath sounds normal.  Diminished in Bases. Continuous Oxygen 3 liters nasal cannula  Musculoskeletal:  Normal Muscle Bulk and Muscle Testing Reveals: Upper Extremities: Full ROM and Muscle Strength 5/5 Back without spinal or paraspinal tenderness Lower Extremities: Full ROM and Muscle Strength  5/5 Arises from chair with ease Narrow Based Gait  Neurological: She is alert and oriented to person, place, and time.  Skin: Skin is warm and dry.  Psychiatric: She has a normal mood and affect.  Nursing note and vitals reviewed.         Assessment & Plan:  1. History of lumbar spinal stenosis:  Refilled: Hydrocodone 5/325mg  one tablet every 8 hours #95  2. Recent left occipital infarct, history of factor V 5 Leiden deficiency: On coumadin. Continue to monitor  3. Fibromyalgia syndrome: Continue with exercise regime.  4. Polymyalgia rheumatica: Rheumatology Following 5. Chronic pain syndrome: Continue with exercise, heat, and ice therapy. 6. Nicotine Dependence: Encouraged Smoking Cessation  20 minutes of face to face patient care time was spent during this visit. All questions were encouraged and answered.   F/U in 1 month

## 2015-12-11 LAB — TOXASSURE SELECT,+ANTIDEPR,UR: PDF: 0

## 2015-12-12 NOTE — Progress Notes (Signed)
Urine drug screen for this encounter is consistent for prescribed medication 

## 2016-01-06 ENCOUNTER — Encounter: Payer: Medicare Other | Attending: Physical Medicine & Rehabilitation | Admitting: Registered Nurse

## 2016-01-06 ENCOUNTER — Encounter: Payer: Self-pay | Admitting: Registered Nurse

## 2016-01-06 VITALS — BP 134/53 | HR 60

## 2016-01-06 DIAGNOSIS — M4806 Spinal stenosis, lumbar region: Secondary | ICD-10-CM | POA: Diagnosis not present

## 2016-01-06 DIAGNOSIS — M353 Polymyalgia rheumatica: Secondary | ICD-10-CM | POA: Insufficient documentation

## 2016-01-06 DIAGNOSIS — Z79899 Other long term (current) drug therapy: Secondary | ICD-10-CM

## 2016-01-06 DIAGNOSIS — G894 Chronic pain syndrome: Secondary | ICD-10-CM | POA: Insufficient documentation

## 2016-01-06 DIAGNOSIS — Z5181 Encounter for therapeutic drug level monitoring: Secondary | ICD-10-CM | POA: Diagnosis not present

## 2016-01-06 DIAGNOSIS — M542 Cervicalgia: Secondary | ICD-10-CM

## 2016-01-06 DIAGNOSIS — M48061 Spinal stenosis, lumbar region without neurogenic claudication: Secondary | ICD-10-CM

## 2016-01-06 MED ORDER — HYDROCODONE-ACETAMINOPHEN 5-325 MG PO TABS: 1.0000 | ORAL_TABLET | Freq: Three times a day (TID) | ORAL | Status: DC | PRN

## 2016-01-06 NOTE — Progress Notes (Signed)
Subjective:    Patient ID: [name], female    DOB: 02-Dec-1945, 70 y.o.   MRN: [id number]  HPI: [name] is a 70 year old female who returns for follow up for chronic pain and medication refill. Her pain is located in her neck and lower back.She rates her pain 7. Her current exercise regime is walking short distances in her home. She wears oxygen with outdoor activities 3 liters nasal cannula.  Her daughter states her mother ( [name]) has lost 26 lbs in 4 months, she has a new Solicitor Dr. Gwenevere Abbot she's schedule for CT scan on Monday 01/09/2016.  Pain Inventory Average Pain 6 Pain Right Now 7 My pain is constant, dull, stabbing, tingling and aching  In the last 24 hours, has pain interfered with the following? General activity 5 Relation with others 3 Enjoyment of life 4 What TIME of day is your pain at its worst? morning daytime evening Sleep (in general) Good  Pain is worse with: walking, bending and standing Pain improves with: rest, heat/ice, pacing activities and medication Relief from Meds: 8  Mobility walk without assistance how many minutes can you walk? 15 ability to climb steps?  yes do you drive?  yes  Function retired I need assistance with the following:  meal prep, household duties and shopping  Neuro/Psych bladder control problems tingling trouble walking spasms anxiety  Prior Studies Any changes since last visit?  no  Physicians involved in your care Any changes since last visit?  no   Family History  Problem Relation Age of Onset  . Heart disease Father   . Factor V Leiden deficiency Father   . Factor V Leiden deficiency Brother   . Factor V Leiden deficiency Daughter    Social History   Social History  . Marital Status: Married    Spouse Name: N/A  . Number of Children: N/A  . Years of Education: N/A   Social History Main Topics  . Smoking status: Current Some Day Smoker  . Smokeless tobacco:  Never Used     Comment: Smokes e cigarettes  . Alcohol Use: No  . Drug Use: None  . Sexual Activity: Not Asked   Other Topics Concern  . None   Social History Narrative   Past Surgical History  Procedure Laterality Date  . Cataract extraction    . Cholecystectomy    . Abdominal hysterectomy    . Breast surgery     Past Medical History  Diagnosis Date  . Hypertension   . Arthritis   . Cataract   . COPD (chronic obstructive pulmonary disease) (HCC)   . Factor 5 Leiden mutation, heterozygous (HCC)   . Myocardial infarction (HCC)    BP 134/53 mmHg  Pulse 60  SpO2 99%  Opioid Risk Score:   Fall Risk Score:  `1  Depression screen PHQ 2/9  Depression screen [facility] 2/9 01/06/2016 12/06/2015 08/08/2015 07/11/2015 02/18/2015  Decreased Interest Down, Depressed, Hopeless 0 0  PHQ - 2 Score Altered sleeping - - - - 3  Tired, decreased energy - - - - 1  Change in appetite - - - - 0  Feeling bad or failure about yourself  - - - - 0  Trouble concentrating - - - - 0  Moving slowly or fidgety/restless - - - - 0  Suicidal thoughts - - - - 0  PHQ-9 Score - - - -  7     Review of Systems  Constitutional: Positive for appetite change and unexpected weight change.  Respiratory: Positive for shortness of breath.   Gastrointestinal: Positive for nausea, abdominal pain and constipation.  Hematological: Bruises/bleeds easily.  All other systems reviewed and are negative.      Objective:   Physical Exam  Constitutional: She is oriented to person, place, and time. She appears well-developed and well-nourished.  HENT:  Head: Normocephalic and atraumatic.  Neck: Normal range of motion. Neck supple.  Cardiovascular: Normal rate and regular rhythm.   Pulmonary/Chest: Effort normal and breath sounds normal.  Continuous Oxygen at 3 liters nasal cannula  Musculoskeletal:  Normal Muscle Bulk and Muscle Testing Reveals: Upper Extremities: Full ROM and Muscle  Strength 5/5 Lumbar Paraspinal Tenderness: L-3- L-5 Lower Extremities: Full ROM and Muscle Strength 5/5 Arises from chair with ease Narrow Based Gait  Neurological: She is alert and oriented to person, place, and time.  Skin: Skin is warm and dry.  Psychiatric: She has a normal mood and affect.  Nursing note and vitals reviewed.         Assessment & Plan:  1. History of lumbar spinal stenosis:  Refilled: Hydrocodone 5/325mg  one tablet every 8 hours #95  2. Recent left occipital infarct, history of factor V 5 Leiden deficiency: On coumadin. Continue to monitor  3. Fibromyalgia syndrome: Continue with exercise regime.  4. Polymyalgia rheumatica: Rheumatology Following 5. Chronic pain syndrome: Continue with exercise, heat, and ice therapy. 6. Nicotine Dependence: Encouraged Smoking Cessation  20 minutes of face to face patient care time was spent during this visit. All questions were encouraged and answered.   F/U in 1 month

## 2016-02-10 ENCOUNTER — Encounter: Payer: Medicare Other | Attending: Physical Medicine & Rehabilitation | Admitting: Registered Nurse

## 2016-02-10 ENCOUNTER — Encounter: Payer: Self-pay | Admitting: Registered Nurse

## 2016-02-10 VITALS — BP 130/48 | HR 64

## 2016-02-10 DIAGNOSIS — F17290 Nicotine dependence, other tobacco product, uncomplicated: Secondary | ICD-10-CM

## 2016-02-10 DIAGNOSIS — Z5181 Encounter for therapeutic drug level monitoring: Secondary | ICD-10-CM | POA: Diagnosis not present

## 2016-02-10 DIAGNOSIS — G894 Chronic pain syndrome: Secondary | ICD-10-CM | POA: Diagnosis present

## 2016-02-10 DIAGNOSIS — M353 Polymyalgia rheumatica: Secondary | ICD-10-CM | POA: Insufficient documentation

## 2016-02-10 DIAGNOSIS — F17200 Nicotine dependence, unspecified, uncomplicated: Secondary | ICD-10-CM

## 2016-02-10 DIAGNOSIS — Z79899 Other long term (current) drug therapy: Secondary | ICD-10-CM | POA: Insufficient documentation

## 2016-02-10 DIAGNOSIS — M48061 Spinal stenosis, lumbar region without neurogenic claudication: Secondary | ICD-10-CM

## 2016-02-10 DIAGNOSIS — M4806 Spinal stenosis, lumbar region: Secondary | ICD-10-CM | POA: Insufficient documentation

## 2016-02-10 MED ORDER — HYDROCODONE-ACETAMINOPHEN 5-325 MG PO TABS: 1.0000 | ORAL_TABLET | Freq: Three times a day (TID) | ORAL | Status: DC | PRN

## 2016-02-10 NOTE — Progress Notes (Signed)
Subjective:    Patient ID: [name], female    DOB: 1946/03/15, 70 y.o.   MRN: [id number]  HPI: [name] is a 70 year old female who returns for follow up for chronic pain and medication refill. She states her painis usually located in her lower back, at this time she is pain free.She rated her pain yesterday at  5. Her current exercise regime is walking short distances in her home. She wears oxygen with outdoor activities 3 liters nasal cannula. Daughter in room.  Pain Inventory Average Pain 6 Pain Right Now 5 My pain is constant and dull  In the last 24 hours, has pain interfered with the following? General activity 5 Relation with others 3 Enjoyment of life 5 What TIME of day is your pain at its worst? morning Sleep (in general) Fair  Pain is worse with: walking, bending, sitting, inactivity, standing and some activites Pain improves with: heat/ice Relief from Meds: 7  Mobility walk without assistance ability to climb steps?  yes do you drive?  yes  Function I need assistance with the following:  meal prep, household duties and shopping  Neuro/Psych bladder control problems weakness numbness tingling  Prior Studies Any changes since last visit?  no  Physicians involved in your care Any changes since last visit?  no   Family History  Problem Relation Age of Onset  . Heart disease Father   . Factor V Leiden deficiency Father   . Factor V Leiden deficiency Brother   . Factor V Leiden deficiency Daughter    Social History   Social History  . Marital Status: Married    Spouse Name: N/A  . Number of Children: N/A  . Years of Education: N/A   Social History Main Topics  . Smoking status: Current Some Day Smoker  . Smokeless tobacco: Never Used     Comment: Smokes e cigarettes  . Alcohol Use: No  . Drug Use: None  . Sexual Activity: Not Asked   Other Topics Concern  . None   Social History Narrative   Past Surgical History    Procedure Laterality Date  . Cataract extraction    . Cholecystectomy    . Abdominal hysterectomy    . Breast surgery     Past Medical History  Diagnosis Date  . Hypertension   . Arthritis   . Cataract   . COPD (chronic obstructive pulmonary disease) (HCC)   . Factor 5 Leiden mutation, heterozygous (HCC)   . Myocardial infarction (HCC)    BP 130/48 mmHg  Pulse 64  SpO2 95%  Opioid Risk Score:   Fall Risk Score:  `1  Depression screen PHQ 2/9  Depression screen [facility] 2/9 01/06/2016 12/06/2015 08/08/2015 07/11/2015 02/18/2015  Decreased Interest Down, Depressed, Hopeless 0 0  PHQ - 2 Score Altered sleeping - - - - 3  Tired, decreased energy - - - - 1  Change in appetite - - - - 0  Feeling bad or failure about yourself  - - - - 0  Trouble concentrating - - - - 0  Moving slowly or fidgety/restless - - - - 0  Suicidal thoughts - - - - 0  PHQ-9 Score - - - - 7     Review of Systems  Constitutional: Positive for appetite change and unexpected weight change.  Respiratory: Positive for apnea.   Gastrointestinal: Positive for nausea, abdominal  pain and constipation.  Hematological: Bruises/bleeds easily.  All other systems reviewed and are negative.      Objective:   Physical Exam  Constitutional: She is oriented to person, place, and time. She appears well-developed and well-nourished.  HENT:  Head: Normocephalic and atraumatic.  Neck: Normal range of motion. Neck supple.  Cardiovascular: Normal rate and regular rhythm.   Pulmonary/Chest: Effort normal and breath sounds normal.  Musculoskeletal:  Normal Muscle Bulk and Muscle Testing Reveals: Upper Extremities: Full ROM and Muscle Strength 5/5 Back without spinal tenderness Lower Extremities: Full ROM and Muscle Strength 5/5 Arises from chair with ease Narrow Based Gait  Neurological: She is alert and oriented to person, place, and time.  Skin: Skin is warm and dry.  Psychiatric: She has a  normal mood and affect.  Nursing note and vitals reviewed.         Assessment & Plan:  1. History of lumbar spinal stenosis:  Refilled: Hydrocodone 5/325mg  one tablet every 8 hours #95  2. Recent left occipital infarct, history of factor V 5 Leiden deficiency: On coumadin. Continue to monitor  3. Fibromyalgia syndrome: Continue with exercise regime.  4. Polymyalgia rheumatica: Rheumatology Following 5. Chronic pain syndrome: Continue with exercise, heat, and ice therapy. 6. Nicotine Dependence: Encouraged Smoking Cessation  20 minutes of face to face patient care time was spent during this visit. All questions were encouraged and answered.   F/U in 1 month

## 2016-03-16 ENCOUNTER — Encounter: Payer: Medicare Other | Admitting: Registered Nurse

## 2016-03-27 ENCOUNTER — Encounter: Payer: Medicare Other | Admitting: Registered Nurse

## 2016-03-29 ENCOUNTER — Ambulatory Visit: Payer: Medicare Other | Admitting: Registered Nurse

## 2016-03-30 ENCOUNTER — Encounter: Payer: Medicare Other | Attending: Physical Medicine & Rehabilitation | Admitting: Registered Nurse

## 2016-03-30 ENCOUNTER — Encounter: Payer: Self-pay | Admitting: Registered Nurse

## 2016-03-30 VITALS — BP 115/73 | HR 60

## 2016-03-30 DIAGNOSIS — M48061 Spinal stenosis, lumbar region without neurogenic claudication: Secondary | ICD-10-CM

## 2016-03-30 DIAGNOSIS — M353 Polymyalgia rheumatica: Secondary | ICD-10-CM | POA: Diagnosis not present

## 2016-03-30 DIAGNOSIS — Z5181 Encounter for therapeutic drug level monitoring: Secondary | ICD-10-CM | POA: Diagnosis not present

## 2016-03-30 DIAGNOSIS — F17200 Nicotine dependence, unspecified, uncomplicated: Secondary | ICD-10-CM

## 2016-03-30 DIAGNOSIS — M4806 Spinal stenosis, lumbar region: Secondary | ICD-10-CM | POA: Insufficient documentation

## 2016-03-30 DIAGNOSIS — Z79899 Other long term (current) drug therapy: Secondary | ICD-10-CM | POA: Diagnosis not present

## 2016-03-30 DIAGNOSIS — G894 Chronic pain syndrome: Secondary | ICD-10-CM | POA: Diagnosis present

## 2016-03-30 DIAGNOSIS — R634 Abnormal weight loss: Secondary | ICD-10-CM

## 2016-03-30 DIAGNOSIS — F17290 Nicotine dependence, other tobacco product, uncomplicated: Secondary | ICD-10-CM

## 2016-03-30 MED ORDER — HYDROCODONE-ACETAMINOPHEN 5-325 MG PO TABS: 1.0000 | ORAL_TABLET | Freq: Three times a day (TID) | ORAL | Status: DC | PRN

## 2016-03-30 NOTE — Progress Notes (Signed)
Subjective:    Patient ID: [name], female    DOB: 18-Aug-1946, 70 y.o.   MRN: [id number]  HPI: [name] is a 70 year old female who returns for follow up for chronic pain and medication refill. She states her painis usually located in her lower back radiating into her right lower extremity posteriorly.She rates her pain 6. Her current exercise regime is walking short distances in her home. She wears oxygen with outdoor activities 3 liters nasal cannula.  [name] arrived with her oxygen on, she removed her oxygen saturation was noted to be 80 %, oxygen was applied her saturation increased to 92%. She was encouraged to wear her oxygen at all times.   Also states her husband will be starting peritoneal dialysis soon.  Daughter in room.  Pain Inventory Average Pain 6 Pain Right Now 6 My pain is intermittent, dull, tingling and aching  In the last 24 hours, has pain interfered with the following? General activity 6 Relation with others 6 Enjoyment of life 4 What TIME of day is your pain at its worst? morning, daytime  Sleep (in general) Fair  Pain is worse with: walking, bending and sitting Pain improves with: heat/ice, pacing activities and medication Relief from Meds: 7  Mobility walk without assistance how many minutes can you walk? 15 ability to climb steps?  yes do you drive?  yes Do you have any goals in this area?  no  Function retired Do you have any goals in this area?  yes  Neuro/Psych weakness trouble walking spasms  Prior Studies Any changes since last visit?  no  Physicians involved in your care Neurologist . Rheumatologist . Neurosurgeon .   Family History  Problem Relation Age of Onset  . Heart disease Father   . Factor V Leiden deficiency Father   . Factor V Leiden deficiency Brother   . Factor V Leiden deficiency Daughter    Social History   Social History  . Marital Status: Married    Spouse Name: N/A  . Number  of Children: N/A  . Years of Education: N/A   Social History Main Topics  . Smoking status: Current Some Day Smoker  . Smokeless tobacco: Never Used     Comment: Smokes e cigarettes  . Alcohol Use: No  . Drug Use: None  . Sexual Activity: Not Asked   Other Topics Concern  . None   Social History Narrative   Past Surgical History  Procedure Laterality Date  . Cataract extraction    . Cholecystectomy    . Abdominal hysterectomy    . Breast surgery     Past Medical History  Diagnosis Date  . Hypertension   . Arthritis   . Cataract   . COPD (chronic obstructive pulmonary disease) (HCC)   . Factor 5 Leiden mutation, heterozygous (HCC)   . Myocardial infarction (HCC)    BP 115/73 mmHg  Pulse 60  SpO2 92%  Opioid Risk Score:   Fall Risk Score:  `1  Depression screen PHQ 2/9  Depression screen [facility] 2/9 01/06/2016 12/06/2015 08/08/2015 07/11/2015 02/18/2015  Decreased Interest Down, Depressed, Hopeless 0 0  PHQ - 2 Score Altered sleeping - - - - 3  Tired, decreased energy - - - - 1  Change in appetite - - - - 0  Feeling bad or failure about yourself  - - - - 0  Trouble concentrating - - - - 0  Moving slowly or fidgety/restless - - - - 0  Suicidal thoughts - - - - 0  PHQ-9 Score - - - - 7     Review of Systems  Constitutional: Positive for appetite change and unexpected weight change.  Respiratory: Positive for shortness of breath and wheezing.   Gastrointestinal: Positive for nausea, abdominal pain and constipation.  Musculoskeletal: Positive for gait problem.  Neurological: Positive for weakness.       Spasms   Hematological: Bruises/bleeds easily.  All other systems reviewed and are negative.      Objective:   Physical Exam  Constitutional: She is oriented to person, place, and time. She appears well-developed.  Losing weight  HENT:  Head: Normocephalic and atraumatic.  Neck: Normal range of motion. Neck supple.  Cardiovascular:  Normal rate and regular rhythm.   Pulmonary/Chest: Effort normal and breath sounds normal.  Continuous Oxygen 3 liters nasal cannula  Musculoskeletal:  Normal Muscle Bulk and Muscle Testing Reveals: Upper Extremities: Full ROM and Muscle Strength 5/5 Lumbar without tenderness Noted Lower Extremities: Arises from chair slowly  Narrow Based Gait  Neurological: She is alert and oriented to person, place, and time.  Skin: Skin is warm and dry.  Psychiatric: She has a normal mood and affect.  Nursing note and vitals reviewed.         Assessment & Plan:  1. History of lumbar spinal stenosis:  Refilled: Hydrocodone 5/325mg  one tablet every 8 hours #95  2. Recent left occipital infarct, history of factor V 5 Leiden deficiency: On coumadin. Continue to monitor  3. Fibromyalgia syndrome: Continue with exercise regime.  4. Polymyalgia rheumatica: Rheumatology Following 5. Chronic pain syndrome: Continue with exercise, heat, and ice therapy. 6. Nicotine Dependence: Encouraged Smoking Cessation 7. FEN/ Weight Loss: GI Following: Encouraged to eat and drink supplements.   20 minutes of face to face patient care time was spent during this visit. All questions were encouraged and answered.   F/U in 1 month

## 2016-04-08 LAB — TOXASSURE SELECT,+ANTIDEPR,UR

## 2016-04-10 ENCOUNTER — Telehealth: Payer: Self-pay | Admitting: Registered Nurse

## 2016-04-10 NOTE — Progress Notes (Signed)
Urine drug screen for this encounter is consistent for prescribed medications.   

## 2016-05-04 ENCOUNTER — Encounter: Payer: Medicare Other | Attending: Physical Medicine & Rehabilitation

## 2016-05-04 ENCOUNTER — Ambulatory Visit (HOSPITAL_BASED_OUTPATIENT_CLINIC_OR_DEPARTMENT_OTHER): Payer: Medicare Other | Admitting: Physical Medicine & Rehabilitation

## 2016-05-04 ENCOUNTER — Encounter: Payer: Self-pay | Admitting: Physical Medicine & Rehabilitation

## 2016-05-04 VITALS — BP 154/62 | HR 69 | Resp 16

## 2016-05-04 DIAGNOSIS — M353 Polymyalgia rheumatica: Secondary | ICD-10-CM | POA: Diagnosis not present

## 2016-05-04 DIAGNOSIS — G894 Chronic pain syndrome: Secondary | ICD-10-CM | POA: Insufficient documentation

## 2016-05-04 DIAGNOSIS — Z5181 Encounter for therapeutic drug level monitoring: Secondary | ICD-10-CM | POA: Diagnosis not present

## 2016-05-04 DIAGNOSIS — M4806 Spinal stenosis, lumbar region: Secondary | ICD-10-CM

## 2016-05-04 DIAGNOSIS — Z79899 Other long term (current) drug therapy: Secondary | ICD-10-CM | POA: Diagnosis not present

## 2016-05-04 DIAGNOSIS — M48061 Spinal stenosis, lumbar region without neurogenic claudication: Secondary | ICD-10-CM

## 2016-05-04 MED ORDER — HYDROCODONE-ACETAMINOPHEN 5-325 MG PO TABS: 1.0000 | ORAL_TABLET | Freq: Three times a day (TID) | ORAL | Status: DC | PRN

## 2016-05-04 NOTE — Patient Instructions (Signed)
Osteoporosis  Osteoporosis is the thinning and loss of density in the bones. Osteoporosis makes the bones more brittle, fragile, and likely to break (fracture). Over time, osteoporosis can cause the bones to become so weak that they fracture after a simple fall. The bones most likely to fracture are the bones in the hip, wrist, and spine.  CAUSES   The exact cause is not known.  RISK FACTORS  Anyone can develop osteoporosis. You may be at greater risk if you have a family history of the condition or have poor nutrition. You may also have a higher risk if you are:   · Female.    · 50 years old or older.  · A smoker.  · Not physically active.    · White or Asian.  · Slender.  SIGNS AND SYMPTOMS   A fracture might be the first sign of the disease, especially if it results from a fall or injury that would not usually cause a bone to break. Other signs and symptoms include:   · Low back and neck pain.  · Stooped posture.  · Height loss.  DIAGNOSIS   To make a diagnosis, your health care provider may:  · Take a medical history.  · Perform a physical exam.  · Order tests, such as:    A bone mineral density test.    A dual-energy X-ray absorptiometry test.  TREATMENT   The goal of osteoporosis treatment is to strengthen your bones to reduce your risk of a fracture. Treatment may involve:  · Making lifestyle changes, such as:    Eating a diet rich in calcium.    Doing weight-bearing and muscle-strengthening exercises.    Stopping tobacco use.    Limiting alcohol intake.  · Taking medicine to slow the process of bone loss or to increase bone density.  · Monitoring your levels of calcium and vitamin D.  HOME CARE INSTRUCTIONS  · Include calcium and vitamin D in your diet. Calcium is important for bone health, and vitamin D helps the body absorb calcium.  · Perform weight-bearing and muscle-strengthening exercises as directed by your health care provider.  · Do not use any tobacco products, including cigarettes, chewing  tobacco, and electronic cigarettes. If you need help quitting, ask your health care provider.  · Limit your alcohol intake.  · Take medicines only as directed by your health care provider.  · Keep all follow-up visits as directed by your health care provider. This is important.  · Take precautions at home to lower your risk of falling, such as:    Keeping rooms well lit and clutter free.    Installing safety rails on stairs.    Using rubber mats in the bathroom and other areas that are often wet or slippery.  SEEK IMMEDIATE MEDICAL CARE IF:   You fall or injure yourself.      This information is not intended to replace advice given to you by your health care provider. Make sure you discuss any questions you have with your health care provider.     Document Released: 08/15/2005 Document Revised: 11/26/2014 Document Reviewed: 04/15/2014  [employer] Interactive Patient Education ©2016 [employer]

## 2016-05-04 NOTE — Progress Notes (Signed)
Subjective:    Patient ID: [name], female    DOB: 25-Jul-1946, 70 y.o.   MRN: [id number]  HPI 37 lb weight loss, GI w/u negative, has CT chest pending Still smoking,  On prednisone for PMR,No recent DEXA imaging Plans to follow-up with rheumatologist Continues with chronic low back pain but no pain radiating into the lower extremities Pain Inventory Average Pain 7 Pain Right Now 7 My pain is Na  In the last 24 hours, has pain interfered with the following? General activity 5 Relation with others 3 Enjoyment of life 5 What TIME of day is your pain at its worst? morning Sleep (in general) Fair  Pain is worse with: walking, bending, standing and some activites Pain improves with: rest, heat/ice and pacing activities Relief from Meds: Na  Mobility walk without assistance ability to climb steps?  yes do you drive?  yes transfers alone  Function Do you have any goals in this area?  no  Neuro/Psych bladder control problems weakness numbness tingling trouble walking anxiety  Prior Studies Any changes since last visit?  no  Physicians involved in your care Any changes since last visit?  no   Family History  Problem Relation Age of Onset  . Heart disease Father   . Factor V Leiden deficiency Father   . Factor V Leiden deficiency Brother   . Factor V Leiden deficiency Daughter    Social History   Social History  . Marital Status: Married    Spouse Name: N/A  . Number of Children: N/A  . Years of Education: N/A   Social History Main Topics  . Smoking status: Current Some Day Smoker  . Smokeless tobacco: Never Used     Comment: Smokes e cigarettes  . Alcohol Use: No  . Drug Use: None  . Sexual Activity: Not Asked   Other Topics Concern  . None   Social History Narrative   Past Surgical History  Procedure Laterality Date  . Cataract extraction    . Cholecystectomy    . Abdominal hysterectomy    . Breast surgery     Past Medical History    Diagnosis Date  . Hypertension   . Arthritis   . Cataract   . COPD (chronic obstructive pulmonary disease) (HCC)   . Factor 5 Leiden mutation, heterozygous (HCC)   . Myocardial infarction (HCC)    BP 154/62 mmHg  Pulse 69  Resp 16  SpO2 98%  Opioid Risk Score:   Fall Risk Score:  `1  Depression screen PHQ 2/9  Depression screen [facility] 2/9 05/04/2016 01/06/2016 12/06/2015 08/08/2015 07/11/2015 02/18/2015  Decreased Interest 0 Down, Depressed, Hopeless 0 0 0  PHQ - 2 Score 0 Altered sleeping - - - - - 3  Tired, decreased energy - - - - - 1  Change in appetite - - - - - 0  Feeling bad or failure about yourself  - - - - - 0  Trouble concentrating - - - - - 0  Moving slowly or fidgety/restless - - - - - 0  Suicidal thoughts - - - - - 0  PHQ-9 Score - - - - - 7      Review of Systems  Constitutional: Positive for appetite change.  Respiratory: Positive for shortness of breath.   Gastrointestinal: Positive for nausea, abdominal pain and constipation.  All other systems reviewed and are negative.  Objective:   Physical Exam  Constitutional: She is oriented to person, place, and time. She appears well-developed and well-nourished.  HENT:  Head: Normocephalic and atraumatic.  Eyes: Conjunctivae and EOM are normal. Pupils are equal, round, and reactive to light.  Neck: Normal range of motion.  Musculoskeletal:       Right hip: She exhibits no bony tenderness.       Left hip: She exhibits no bony tenderness.       Cervical back: She exhibits normal range of motion, no tenderness and no deformity.       Thoracic back: She exhibits normal range of motion, no tenderness and no deformity.       Lumbar back: She exhibits tenderness. She exhibits no deformity.  Negative straight leg raise testing bilateral lower extremities  Neurological: She is alert and oriented to person, place, and time. No sensory deficit.  Motor strength is 5/5 bilateral deltoid,  biceps, triceps, grip, hip flexor, knee extensor, ankle dorsiflexor and plantar flexor  Psychiatric: She has a normal mood and affect.  Nursing note and vitals reviewed.         Assessment & Plan:  1. History of lumbar spinal stenosis: No evidence of radiculopathy Refilled: Hydrocodone 5/325mg  one tablet every 8 hours #95  Recent toxicology May 2017 was appropriate. She has had prescription from PCP for alprazolam 2. Recent left occipital infarct, history of factor V 5 Leiden deficiency: On coumadin. Continue to monitor  3. Fibromyalgia syndrome: Continue with exercise regime.  4. Polymyalgia rheumatica: Rheumatology Following, Recommend DEXA to assess bone mass given chronic steroids as well as smoking, discussed with daughter 5. Chronic pain syndrome: Continue with exercise, heat, and ice therapy. 6. Nicotine Dependence: Encouraged Smoking Cessation 7. FEN/ Weight Loss: GI Following: Encouraged to eat and drink supplements.

## 2016-06-01 ENCOUNTER — Encounter: Payer: Medicare Other | Admitting: Registered Nurse

## 2016-06-11 ENCOUNTER — Encounter: Payer: Self-pay | Admitting: Registered Nurse

## 2016-06-11 ENCOUNTER — Encounter: Payer: Medicare Other | Attending: Physical Medicine & Rehabilitation | Admitting: Registered Nurse

## 2016-06-11 VITALS — BP 135/62 | HR 61

## 2016-06-11 DIAGNOSIS — M353 Polymyalgia rheumatica: Secondary | ICD-10-CM | POA: Insufficient documentation

## 2016-06-11 DIAGNOSIS — F17200 Nicotine dependence, unspecified, uncomplicated: Secondary | ICD-10-CM

## 2016-06-11 DIAGNOSIS — M4806 Spinal stenosis, lumbar region: Secondary | ICD-10-CM | POA: Diagnosis not present

## 2016-06-11 DIAGNOSIS — F17290 Nicotine dependence, other tobacco product, uncomplicated: Secondary | ICD-10-CM

## 2016-06-11 DIAGNOSIS — Z5181 Encounter for therapeutic drug level monitoring: Secondary | ICD-10-CM | POA: Insufficient documentation

## 2016-06-11 DIAGNOSIS — Z79899 Other long term (current) drug therapy: Secondary | ICD-10-CM | POA: Insufficient documentation

## 2016-06-11 DIAGNOSIS — G894 Chronic pain syndrome: Secondary | ICD-10-CM | POA: Diagnosis not present

## 2016-06-11 DIAGNOSIS — M48061 Spinal stenosis, lumbar region without neurogenic claudication: Secondary | ICD-10-CM

## 2016-06-11 MED ORDER — HYDROCODONE-ACETAMINOPHEN 5-325 MG PO TABS: 1.0000 | ORAL_TABLET | Freq: Three times a day (TID) | ORAL | 0 refills | Status: DC | PRN

## 2016-06-11 NOTE — Progress Notes (Signed)
Subjective:    Patient ID: [name], female    DOB: 1946/02/24, 70 y.o.   MRN: [id number]  HPI: [name] is a 70 year old female who returns for follow up for chronic pain and medication refill. She states her painis usually located in her neck and lower back.She rates her pain 6. Her current exercise regime is walking short distances in her home, encouraged to increase her activity as tolerated.  Daughter in room.  Pain Inventory Average Pain 6 Pain Right Now 6 My pain is constant  In the last 24 hours, has pain interfered with the following? General activity 6 Relation with others 4 Enjoyment of life 6 What TIME of day is your pain at its worst? morning evening and night Sleep (in general) Fair  Pain is worse with: walking, bending and standing Pain improves with: heat/ice, pacing activities and medication Relief from Meds: 7  Mobility walk without assistance ability to climb steps?  yes do you drive?  yes  Function retired  Neuro/Psych bladder control problems weakness numbness tingling spasms anxiety  Prior Studies Any changes since last visit?  no  Physicians involved in your care Any changes since last visit?  no   Family History  Problem Relation Age of Onset  . Heart disease Father   . Factor V Leiden deficiency Father   . Factor V Leiden deficiency Brother   . Factor V Leiden deficiency Daughter    Social History   Social History  . Marital status: Married    Spouse name: N/A  . Number of children: N/A  . Years of education: N/A   Social History Main Topics  . Smoking status: Current Some Day Smoker  . Smokeless tobacco: Never Used     Comment: Smokes e cigarettes  . Alcohol use No  . Drug use: Unknown  . Sexual activity: Not Asked   Other Topics Concern  . None   Social History Narrative  . None   Past Surgical History:  Procedure Laterality Date  . ABDOMINAL HYSTERECTOMY    . BREAST SURGERY    . CATARACT  EXTRACTION    . CHOLECYSTECTOMY     Past Medical History:  Diagnosis Date  . Arthritis   . Cataract   . COPD (chronic obstructive pulmonary disease) (HCC)   . Factor 5 Leiden mutation, heterozygous (HCC)   . Hypertension   . Myocardial infarction (HCC)    BP 135/62 (BP Location: Left Arm)   Pulse 61   SpO2 93%   Opioid Risk Score:   Fall Risk Score:  `1  Depression screen PHQ 2/9  Depression screen [facility] 2/9 06/11/2016 05/04/2016 01/06/2016 12/06/2015 08/08/2015 07/11/2015 02/18/2015  Decreased Interest 0 0 Down, Depressed, Hopeless 0 0 0 0  PHQ - 2 Score 0 0 Altered sleeping - - - - - - 3  Tired, decreased energy - - - - - - 1  Change in appetite - - - - - - 0  Feeling bad or failure about yourself  - - - - - - 0  Trouble concentrating - - - - - - 0  Moving slowly or fidgety/restless - - - - - - 0  Suicidal thoughts - - - - - - 0  PHQ-9 Score - - - - - - 7     Review of Systems  All other systems reviewed and are negative.  Objective:   Physical Exam  Constitutional: She is oriented to person, place, and time. She appears well-developed and well-nourished.  HENT:  Head: Normocephalic and atraumatic.  Neck: Normal range of motion. Neck supple.  Cardiovascular: Normal rate and regular rhythm.   Pulmonary/Chest: Effort normal and breath sounds normal.  Continuous Oxygen 3 liters nasal cannula  Musculoskeletal:  Normal Muscle Bulk and Muscle Testing Reveals: Upper Extremities: Full ROM and Muscle Strength 5/5 Back without spinal tenderness noted Lower Extremities: Full ROM and Muscle Strength 5/5 Arises from table with ease Narrow Based Gait   Neurological: She is alert and oriented to person, place, and time.  Skin: Skin is warm and dry.  Psychiatric: She has a normal mood and affect.  Nursing note and vitals reviewed.         Assessment & Plan:  1. History of lumbar spinal stenosis:  Refilled: Hydrocodone 5/325mg  one tablet  every 8 hours, may take an extra tablet when pain is severe. #95  2. Recent left occipital infarct, history of factor V 5 Leiden deficiency: On coumadin. Continue to monitor  3. Fibromyalgia syndrome: Continue with exercise regime.  4. Polymyalgia rheumatica: Rheumatology Following 5. Chronic pain syndrome: Continue with exercise, heat, and ice therapy. 6. Nicotine Dependence: Encouraged Smoking Cessation 7. FEN/ Weight Loss: GI Following: Appetite has improved and gain weight.   20 minutes of face to face patient care time was spent during this visit. All questions were encouraged and answered.   F/U in 1 month

## 2016-06-15 ENCOUNTER — Ambulatory Visit: Payer: Medicare Other | Admitting: Registered Nurse

## 2016-06-27 NOTE — Telephone Encounter (Signed)
error 

## 2016-07-20 ENCOUNTER — Encounter: Payer: Medicare Other | Attending: Physical Medicine & Rehabilitation | Admitting: Registered Nurse

## 2016-07-20 ENCOUNTER — Encounter: Payer: Self-pay | Admitting: Registered Nurse

## 2016-07-20 VITALS — BP 133/70 | HR 60

## 2016-07-20 DIAGNOSIS — M4806 Spinal stenosis, lumbar region: Secondary | ICD-10-CM | POA: Insufficient documentation

## 2016-07-20 DIAGNOSIS — Z5181 Encounter for therapeutic drug level monitoring: Secondary | ICD-10-CM | POA: Insufficient documentation

## 2016-07-20 DIAGNOSIS — G894 Chronic pain syndrome: Secondary | ICD-10-CM | POA: Insufficient documentation

## 2016-07-20 DIAGNOSIS — M353 Polymyalgia rheumatica: Secondary | ICD-10-CM | POA: Diagnosis not present

## 2016-07-20 DIAGNOSIS — Z79899 Other long term (current) drug therapy: Secondary | ICD-10-CM | POA: Insufficient documentation

## 2016-07-20 DIAGNOSIS — M48061 Spinal stenosis, lumbar region without neurogenic claudication: Secondary | ICD-10-CM

## 2016-07-20 MED ORDER — HYDROCODONE-ACETAMINOPHEN 5-325 MG PO TABS: 1.0000 | ORAL_TABLET | Freq: Three times a day (TID) | ORAL | 0 refills | Status: DC | PRN

## 2016-07-20 NOTE — Progress Notes (Signed)
Subjective:    Patient ID: [name], female    DOB: [birth year], 70 y.o.   MRN: [id number]  HPI: [name] is a 70 year old female who returns for follow up for chronic pain and medication refill. She states her painis usually located in her lower back.She rates her pain 5. Her current exercise regime is walking short distances in her home, encouraged to continue to increase her activity as tolerated.  Daughter in room.  Pain Inventory Average Pain 5 Pain Right Now 5 My pain is sharp, tingling and aching  In the last 24 hours, has pain interfered with the following? General activity 6 Relation with others 3 Enjoyment of life 4 What TIME of day is your pain at its worst?  morning, evening Sleep (in general) Poor  Pain is worse with: walking, bending, standing and some activites Pain improves with: rest, heat/ice, pacing activities and medication Relief from Meds: 7  Mobility walk without assistance ability to climb steps?  yes do you drive?  yes transfers alone  Function retired I need assistance with the following:  meal prep, household duties and shopping  Neuro/Psych bladder control problems weakness numbness tingling spasms anxiety  Prior Studies Any changes since last visit?  no  Physicians involved in your care .   Family History  Problem Relation Age of Onset  . Heart disease Father   . Factor V Leiden deficiency Father   . Factor V Leiden deficiency Brother   . Factor V Leiden deficiency Daughter    Social History   Social History  . Marital status: Married    Spouse name: N/A  . Number of children: N/A  . Years of education: N/A   Social History Main Topics  . Smoking status: Current Some Day Smoker  . Smokeless tobacco: Never Used     Comment: Smokes e cigarettes  . Alcohol use No  . Drug use: Unknown  . Sexual activity: Not Asked   Other Topics Concern  . None   Social History Narrative  . None   Past Surgical  History:  Procedure Laterality Date  . ABDOMINAL HYSTERECTOMY    . BREAST SURGERY    . CATARACT EXTRACTION    . CHOLECYSTECTOMY     Past Medical History:  Diagnosis Date  . Arthritis   . Cataract   . COPD (chronic obstructive pulmonary disease) (HCC)   . Factor 5 Leiden mutation, heterozygous (HCC)   . Hypertension   . Myocardial infarction (HCC)    BP 133/70   Pulse 60   SpO2 93%   Opioid Risk Score:   Fall Risk Score:  `1  Depression screen PHQ 2/9  [facility] 2/9 06/11/2016 05/04/2016 01/06/2016 12/06/2015 08/08/2015 07/11/2015 02/18/2015  Decreased Interest 0 0 3 3 3 3 3   Down, Depressed, Hopeless 0 0 3 3 3  0 0  PHQ - 2 Score 0 0 6 6 6 3 3   Altered sleeping - - - - - - 3  Tired, decreased energy - - - - - - 1  Change in appetite - - - - - - 0  Feeling bad or failure about yourself  - - - - - - 0  Trouble concentrating - - - - - - 0  Moving slowly or fidgety/restless - - - - - - 0  Suicidal thoughts - - - - - - 0  PHQ-9 Score - - - - - - 7    Review of Systems  HENT: Negative.   Eyes: Negative.   Respiratory: Positive for shortness of breath.   Cardiovascular: Negative.   Gastrointestinal: Positive for abdominal pain and constipation.  Endocrine: Negative.   Genitourinary: Negative.   Musculoskeletal: Positive for back pain.  Skin: Negative.   Allergic/Immunologic: Negative.   Neurological: Positive for weakness and numbness.  Hematological: Negative.   Psychiatric/Behavioral: The patient is nervous/anxious.        Objective:   Physical Exam  Constitutional: She is oriented to person, place, and time. She appears well-developed and well-nourished.  HENT:  Head: Normocephalic and atraumatic.  Neck: Normal range of motion. Neck supple.  Cardiovascular: Normal rate and regular rhythm.   Pulmonary/Chest: Effort normal and breath sounds normal.  Continuous Oxygen 3 liters nasal cannula  Musculoskeletal:  Normal Muscle Bulk and Muscle Testing  Reveals: Upper Extremities: Full ROM and Muscle Strength 5/5 Lumbar Paraspinal Tenderness: L-4- L5 Lower Extremities: Full ROM and Muscle Strength 5/5 Arises from Table with ease Narrow Based Gait  Neurological: She is alert and oriented to person, place, and time.  Skin: Skin is warm and dry.  Psychiatric: She has a normal mood and affect.  Nursing note and vitals reviewed.         Assessment & Plan:  1. History of lumbar spinal stenosis:  Refilled: Hydrocodone 5/325mg  one tablet every 8 hours, may take an extra tablet when pain is severe. #95  2. Recent left occipital infarct, history of factor V 5 Leiden deficiency: On coumadin. Continue to monitor  3. Fibromyalgia syndrome: Continue with exercise regime.  4. Polymyalgia rheumatica: Rheumatology Following 5. Chronic pain syndrome: Continue with exercise, heat, and ice therapy. 6. Nicotine Dependence: Encouraged Smoking Cessation 7. FEN/ Weight Loss: GI Following: Appetite has improved with weight gain.  20 minutes of face to face patient care time was spent during this visit. All questions were encouraged and answered.   F/U in 1 month

## 2016-09-07 ENCOUNTER — Encounter: Payer: Medicare Other | Admitting: Registered Nurse

## 2016-09-21 ENCOUNTER — Encounter: Payer: Medicare Other | Admitting: Registered Nurse

## 2016-09-24 ENCOUNTER — Encounter: Payer: Self-pay | Admitting: Registered Nurse

## 2016-09-24 ENCOUNTER — Encounter: Payer: Medicare Other | Attending: Physical Medicine & Rehabilitation | Admitting: Registered Nurse

## 2016-09-24 VITALS — BP 119/72 | HR 61

## 2016-09-24 DIAGNOSIS — G894 Chronic pain syndrome: Secondary | ICD-10-CM | POA: Insufficient documentation

## 2016-09-24 DIAGNOSIS — M7061 Trochanteric bursitis, right hip: Secondary | ICD-10-CM

## 2016-09-24 DIAGNOSIS — M48061 Spinal stenosis, lumbar region without neurogenic claudication: Secondary | ICD-10-CM | POA: Diagnosis not present

## 2016-09-24 DIAGNOSIS — M353 Polymyalgia rheumatica: Secondary | ICD-10-CM | POA: Insufficient documentation

## 2016-09-24 DIAGNOSIS — Z5181 Encounter for therapeutic drug level monitoring: Secondary | ICD-10-CM | POA: Diagnosis not present

## 2016-09-24 DIAGNOSIS — Z79899 Other long term (current) drug therapy: Secondary | ICD-10-CM | POA: Insufficient documentation

## 2016-09-24 MED ORDER — HYDROCODONE-ACETAMINOPHEN 5-325 MG PO TABS: 1.0000 | ORAL_TABLET | Freq: Three times a day (TID) | ORAL | 0 refills | Status: DC | PRN

## 2016-09-24 NOTE — Progress Notes (Signed)
Subjective:    Patient ID: [name], female    DOB: [birth year], 70 y.o.   MRN: [id number]  HPI: [name] is a 70 year old female who returns for follow up for chronic pain and medication refill. She states her painis  located in her lower back and right hip.She rates her pain 6. Her current exercise regime is walking short distances in her home, encouraged to continue to increase her activity as tolerated.   Daughter in room.  Pain Inventory Average Pain 5 Pain Right Now 6 My pain is constant  In the last 24 hours, has pain interfered with the following? General activity 6 Relation with others 3 Enjoyment of life 3 What TIME of day is your pain at its worst? morning, evening Sleep (in general) Fair  Pain is worse with: walking, bending, standing and some activites Pain improves with: rest, heat/ice, pacing activities and medication Relief from Meds: 7  Mobility walk without assistance ability to climb steps?  yes do you drive?  yes Do you have any goals in this area?  yes  Function retired I need assistance with the following:  meal prep, household duties and shopping  Neuro/Psych bladder control problems weakness tingling trouble walking spasms anxiety  Prior Studies Any changes since last visit?  no  Physicians involved in your care Any changes since last visit?  no   Family History  Problem Relation Age of Onset  . Heart disease Father   . Factor V Leiden deficiency Father   . Factor V Leiden deficiency Brother   . Factor V Leiden deficiency Daughter    Social History   Social History  . Marital status: Married    Spouse name: N/A  . Number of children: N/A  . Years of education: N/A   Social History Main Topics  . Smoking status: Current Some Day Smoker  . Smokeless tobacco: Never Used     Comment: Smokes e cigarettes  . Alcohol use No  . Drug use: Unknown  . Sexual activity: Not Asked   Other Topics Concern  . None     Social History Narrative  . None   Past Surgical History:  Procedure Laterality Date  . ABDOMINAL HYSTERECTOMY    . BREAST SURGERY    . CATARACT EXTRACTION    . CHOLECYSTECTOMY     Past Medical History:  Diagnosis Date  . Arthritis   . Cataract   . COPD (chronic obstructive pulmonary disease) (HCC)   . Factor 5 Leiden mutation, heterozygous (HCC)   . Hypertension   . Myocardial infarction    BP (!) 134/53 (BP Location: Left Arm, Patient Position: Sitting, Cuff Size: Large)   Pulse 66   SpO2 (!) 88%   Opioid Risk Score:   Fall Risk Score:  `1  Depression screen PHQ 2/9  [facility] 06/11/2016 05/04/2016 01/06/2016 12/06/2015 08/08/2015 07/11/2015 02/18/2015  Decreased Interest 0 0 3 3 3 3 3   Down, Depressed, Hopeless 0 0 3 3 3  0 0  PHQ - 2 Score 0 0 6 6 6 3 3   Altered sleeping - - - - - - 3  Tired, decreased energy - - - - - - 1  Change in appetite - - - - - - 0  Feeling bad or failure about yourself  - - - - - - 0  Trouble concentrating - - - - - - 0  Moving slowly or fidgety/restless - - - - - -  0  Suicidal thoughts - - - - - - 0  PHQ-9 Score - - - - - - 7    Review of Systems  Constitutional: Positive for appetite change.  HENT: Negative.   Eyes: Negative.   Respiratory: Positive for shortness of breath and wheezing.   Cardiovascular: Negative.   Gastrointestinal: Positive for abdominal pain, constipation and nausea.  Endocrine: Negative.   Genitourinary: Negative.   Musculoskeletal: Positive for arthralgias, back pain, gait problem, myalgias, neck pain and neck stiffness.       Spasms  Skin: Negative.   Neurological: Positive for weakness.       Tingling  Hematological: Bruises/bleeds easily.  Psychiatric/Behavioral: The patient is nervous/anxious.   All other systems reviewed and are negative.      Objective:   Physical Exam  Constitutional: She is oriented to person, place, and time. She appears well-developed and well-nourished.  HENT:   Head: Normocephalic and atraumatic.  Neck: Normal range of motion. Neck supple.  Cardiovascular: Normal rate and regular rhythm.   Pulmonary/Chest: Effort normal and breath sounds normal.  Continuous Oxygen 3 liters nasal cannula  Musculoskeletal:  Normal Muscle Bulk and Muscle Testing Reveals:  Upper Extremities: Full ROM and Muscle Strength 5/5 Lumbar Paraspinal Tenderness: L-4-L-5 Lower Extremities: Full ROM and Muscle Strength 5/5 Arises from chair with ease Narrow Based Gait  Neurological: She is alert and oriented to person, place, and time.  Skin: Skin is warm and dry.  Psychiatric: She has a normal mood and affect.  Nursing note and vitals reviewed.         Assessment & Plan:  1. History of lumbar spinal stenosis:  Refilled: Hydrocodone 5/325mg  one tablet every 8 hours, may take an extra tablet when pain is severe.#95  2. Recent left occipital infarct, history of factor V 5 Leiden deficiency: On coumadin. Continue to monitor  3. Fibromyalgia syndrome: Continue with exercise regime.  4. Polymyalgia rheumatica: Rheumatology Following 5. Chronic pain syndrome: Continue with exercise, heat, and ice therapy. 6. Nicotine Dependence: Encouraged Smoking Cessation 7. FEN/ Weight Loss: GI Following: Appetite has improved with weight gain. 8. Right Greater Trochanteric Bursitis: Scheduled Cortisone Injection with Dr. Wynn BankerKirsteins   20 minutes of face to face patient care time was spent during this visit. All questions were encouraged and answered.   F/U in 1 month

## 2016-09-24 NOTE — Addendum Note (Signed)
Addended by: Angela NevinWESSLING, Jermon Chalfant D on: 09/24/2016 04:24 PM   Modules accepted: Orders

## 2016-09-30 LAB — TOXASSURE SELECT,+ANTIDEPR,UR

## 2016-10-03 NOTE — Progress Notes (Signed)
Urine drug screen for this encounter is consistent for prescribed medication 

## 2016-10-23 ENCOUNTER — Encounter: Payer: Self-pay | Admitting: Physical Medicine & Rehabilitation

## 2016-10-23 ENCOUNTER — Ambulatory Visit (HOSPITAL_BASED_OUTPATIENT_CLINIC_OR_DEPARTMENT_OTHER): Payer: Medicare Other | Admitting: Physical Medicine & Rehabilitation

## 2016-10-23 ENCOUNTER — Encounter: Payer: Medicare Other | Attending: Physical Medicine & Rehabilitation

## 2016-10-23 VITALS — BP 135/72 | HR 59 | Resp 14

## 2016-10-23 DIAGNOSIS — G894 Chronic pain syndrome: Secondary | ICD-10-CM | POA: Diagnosis present

## 2016-10-23 DIAGNOSIS — Z79899 Other long term (current) drug therapy: Secondary | ICD-10-CM | POA: Insufficient documentation

## 2016-10-23 DIAGNOSIS — M7061 Trochanteric bursitis, right hip: Secondary | ICD-10-CM

## 2016-10-23 DIAGNOSIS — M353 Polymyalgia rheumatica: Secondary | ICD-10-CM | POA: Diagnosis not present

## 2016-10-23 DIAGNOSIS — Z5181 Encounter for therapeutic drug level monitoring: Secondary | ICD-10-CM | POA: Diagnosis not present

## 2016-10-23 MED ORDER — HYDROCODONE-ACETAMINOPHEN 5-325 MG PO TABS: 1.0000 | ORAL_TABLET | Freq: Three times a day (TID) | ORAL | 0 refills | Status: DC | PRN

## 2016-10-23 NOTE — Progress Notes (Signed)
Subjective:    Patient ID: [name], female    DOB: 10-13-1946, 70 y.o.   MRN: [id number]  HPI   Pain Inventory Average Pain 6 Pain Right Now 6 My pain is constant and aching  In the last 24 hours, has pain interfered with the following? General activity 6 Relation with others 3 Enjoyment of life 4 What TIME of day is your pain at its worst? varies Sleep (in general) Good  Pain is worse with: walking, bending and standing Pain improves with: heat/ice, pacing activities and medication Relief from Meds: 9  Mobility walk without assistance ability to climb steps?  yes do you drive?  yes  Function retired  Neuro/Psych bladder control problems weakness numbness tingling trouble walking spasms anxiety  Prior Studies Any changes since last visit?  no  Physicians involved in your care Any changes since last visit?  no   Family History  Problem Relation Age of Onset  . Heart disease Father   . Factor V Leiden deficiency Father   . Factor V Leiden deficiency Brother   . Factor V Leiden deficiency Daughter    Social History   Social History  . Marital status: Married    Spouse name: N/A  . Number of children: N/A  . Years of education: N/A   Social History Main Topics  . Smoking status: Current Some Day Smoker  . Smokeless tobacco: Never Used     Comment: Smokes e cigarettes  . Alcohol use No  . Drug use: Unknown  . Sexual activity: Not Asked   Other Topics Concern  . None   Social History Narrative  . None   Past Surgical History:  Procedure Laterality Date  . ABDOMINAL HYSTERECTOMY    . BREAST SURGERY    . CATARACT EXTRACTION    . CHOLECYSTECTOMY     Past Medical History:  Diagnosis Date  . Arthritis   . Cataract   . COPD (chronic obstructive pulmonary disease) (HCC)   . Factor 5 Leiden mutation, heterozygous (HCC)   . Hypertension   . Myocardial infarction    BP 135/72 (BP Location: Left Arm, Patient Position: Sitting, Cuff  Size: Normal)   Pulse (!) 59   Resp 14   SpO2 95%   Opioid Risk Score:   Fall Risk Score:  `1  Depression screen PHQ 2/9  [facility] 06/11/2016 05/04/2016 01/06/2016 12/06/2015 08/08/2015 07/11/2015 02/18/2015  Decreased Interest 0 0 3 3 3 3 3   Down, Depressed, Hopeless 0 0 3 3 3  0 0  PHQ - 2 Score 0 0 6 6 6 3 3   Altered sleeping - - - - - - 3  Tired, decreased energy - - - - - - 1  Change in appetite - - - - - - 0  Feeling bad or failure about yourself  - - - - - - 0  Trouble concentrating - - - - - - 0  Moving slowly or fidgety/restless - - - - - - 0  Suicidal thoughts - - - - - - 0  PHQ-9 Score - - - - - - 7    Review of Systems  HENT: Negative.   Eyes: Negative.   Respiratory: Negative.   Cardiovascular: Negative.   Gastrointestinal: Negative.   Endocrine: Negative.   Genitourinary: Negative.   Musculoskeletal: Positive for arthralgias, back pain, gait problem and myalgias.       Spasms  Skin: Negative.   Neurological: Positive for weakness and  numbness.       Tingling  Hematological: Negative.   Psychiatric/Behavioral: The patient is nervous/anxious.   All other systems reviewed and are negative.      Objective:   Physical Exam        Assessment & Plan:     Trochanteric bursa injection With or without ultrasound guidance  Indication Right Trochanteric bursitis. Exam has tenderness over the greater trochanter of the hip. Pain has not responded to conservative care such as exercise therapy and oral medications. Pain interferes with sleep or with mobility Informed consent was obtained after describing risks and benefits of the procedure with the patient these include bleeding bruising and infection. Patient has signed written consent form. Patient placed in a lateral decubitus position with the affected hip superior. Point of maximal pain was palpated marked and prepped with Betadine and entered with a needle to bone contact. Needle slightly  withdrawn then 6mg  of betamethasone with 4 cc 1% lidocaine were injected. Patient tolerated procedure well. Post procedure instructions given.

## 2016-11-19 ENCOUNTER — Other Ambulatory Visit: Payer: Self-pay | Admitting: Registered Nurse

## 2016-11-23 ENCOUNTER — Encounter: Payer: Medicare Other | Admitting: Registered Nurse

## 2016-12-03 ENCOUNTER — Encounter: Payer: Medicare Other | Attending: Physical Medicine & Rehabilitation | Admitting: Registered Nurse

## 2016-12-03 ENCOUNTER — Encounter: Payer: Self-pay | Admitting: Registered Nurse

## 2016-12-03 VITALS — BP 168/67 | HR 56

## 2016-12-03 DIAGNOSIS — G894 Chronic pain syndrome: Secondary | ICD-10-CM | POA: Diagnosis present

## 2016-12-03 DIAGNOSIS — Z79899 Other long term (current) drug therapy: Secondary | ICD-10-CM

## 2016-12-03 DIAGNOSIS — M353 Polymyalgia rheumatica: Secondary | ICD-10-CM | POA: Diagnosis not present

## 2016-12-03 DIAGNOSIS — M542 Cervicalgia: Secondary | ICD-10-CM

## 2016-12-03 DIAGNOSIS — Z5181 Encounter for therapeutic drug level monitoring: Secondary | ICD-10-CM

## 2016-12-03 DIAGNOSIS — M546 Pain in thoracic spine: Secondary | ICD-10-CM | POA: Diagnosis not present

## 2016-12-03 DIAGNOSIS — G609 Hereditary and idiopathic neuropathy, unspecified: Secondary | ICD-10-CM

## 2016-12-03 MED ORDER — VENLAFAXINE HCL 25 MG PO TABS: ORAL_TABLET | ORAL | 1 refills | Status: DC

## 2016-12-03 MED ORDER — HYDROCODONE-ACETAMINOPHEN 5-325 MG PO TABS: 1.0000 | ORAL_TABLET | Freq: Three times a day (TID) | ORAL | 0 refills | Status: DC | PRN

## 2016-12-03 NOTE — Progress Notes (Signed)
Subjective:    Patient ID: [name], female    DOB: 1945-12-28, 71 y.o.   MRN: [id number]  HPI: [name] is a 71 year old female who returns for follow up appointment for chronic pain and medication refill. She states her painis  located in her lower back.  Also states she is experiencing increase intensity of nerve pain, will resume Effexor, she verbalizes understanding. She rates her pain 8. Her current exercise regime is walking short distances in her home, encouraged to continue toincrease her activity as tolerated.   Daughter in room.  Pain Inventory Average Pain 6 Pain Right Now 8 My pain is dull, tingling and aching  In the last 24 hours, has pain interfered with the following? General activity 5 Relation with others 3 Enjoyment of life 5 What TIME of day is your pain at its worst? morning, daytime Sleep (in general) Fair  Pain is worse with: walking, bending, sitting and standing Pain improves with: medication Relief from Meds: 8  Mobility walk without assistance ability to climb steps?  yes  Function retired  Neuro/Psych bladder control problems weakness tingling trouble walking anxiety  Prior Studies Any changes since last visit?  no  Physicians involved in your care Any changes since last visit?  no   Family History  Problem Relation Age of Onset  . Heart disease Father   . Factor V Leiden deficiency Father   . Factor V Leiden deficiency Brother   . Factor V Leiden deficiency Daughter    Social History   Social History  . Marital status: Married    Spouse name: N/A  . Number of children: N/A  . Years of education: N/A   Social History Main Topics  . Smoking status: Current Some Day Smoker  . Smokeless tobacco: Never Used     Comment: Smokes e cigarettes  . Alcohol use No  . Drug use: Unknown  . Sexual activity: Not Asked   Other Topics Concern  . None   Social History Narrative  . None   Past Surgical  History:  Procedure Laterality Date  . ABDOMINAL HYSTERECTOMY    . BREAST SURGERY    . CATARACT EXTRACTION    . CHOLECYSTECTOMY     Past Medical History:  Diagnosis Date  . Arthritis   . Cataract   . COPD (chronic obstructive pulmonary disease) (HCC)   . Factor 5 Leiden mutation, heterozygous (HCC)   . Hypertension   . Myocardial infarction    BP (!) 184/64   Pulse (!) 58   SpO2 95%   Opioid Risk Score:   Fall Risk Score:  `1  Depression screen PHQ 2/9  Depression screen [facility] 2/9 06/11/2016 05/04/2016 01/06/2016 12/06/2015 08/08/2015 07/11/2015 02/18/2015  Decreased Interest 0 0 3 3 3 3 3   Down, Depressed, Hopeless 0 0 3 3 3  0 0  PHQ - 2 Score 0 0 6 6 6 3 3   Altered sleeping - - - - - - 3  Tired, decreased energy - - - - - - 1  Change in appetite - - - - - - 0  Feeling bad or failure about yourself  - - - - - - 0  Trouble concentrating - - - - - - 0  Moving slowly or fidgety/restless - - - - - - 0  Suicidal thoughts - - - - - - 0  PHQ-9 Score - - - - - - 7    Review of Systems  Constitutional: Positive for appetite change.  HENT: Negative.   Eyes: Negative.   Respiratory: Positive for shortness of breath.   Cardiovascular: Negative.   Gastrointestinal: Positive for constipation.  Endocrine: Negative.   Genitourinary: Positive for difficulty urinating.  Musculoskeletal: Positive for arthralgias, back pain and gait problem.  Skin: Negative.   Allergic/Immunologic: Negative.   Neurological: Positive for weakness.       Tingling  Hematological: Bruises/bleeds easily.  Psychiatric/Behavioral: The patient is nervous/anxious.        Objective:   Physical Exam  Constitutional: She is oriented to person, place, and time. She appears well-developed and well-nourished.  HENT:  Head: Normocephalic and atraumatic.  Neck: Normal range of motion. Neck supple.  Cervical Paraspinal Tenderness: C-5-C-6  Cardiovascular: Normal rate and regular rhythm.   Pulmonary/Chest: Effort  normal and breath sounds normal.  Continuous Oxygen 2 Liters Nasal Cannula  Musculoskeletal:  Normal Muscle Bulk and Muscle Testing Reveals: Upper Extremities: Full ROM and Muscle Strength 5/5 Thoracic Paraspinal Tenderness: T-1-T-3 Lumbar Paraspinal Tenderness: L-3-L-5  Lower Extremities: Full ROM and Muscle Strength 5/5 Arises from Table with Ease Narrow Based Gait  Neurological: She is alert and oriented to person, place, and time.  Skin: Skin is warm and dry.  Psychiatric: She has a normal mood and affect.  Nursing note and vitals reviewed.         Assessment & Plan:  1. History of lumbar spinal stenosis:  Refilled: Hydrocodone 5/325mg  one tablet every 8 hours, may take an extra tablet when pain is severe.#95  2. Recent left occipital infarct, history of factor V 5 Leiden deficiency: On coumadin. Continue to monitor  3. Fibromyalgia syndrome: Continue with exercise regime.  4. Polymyalgia rheumatica: Rheumatology Following 5. Chronic pain syndrome: Continue with exercise, heat, and ice therapy. 6. Nicotine Dependence: Encouraged Smoking Cessation 7. FEN/ Weight Loss: GI Following: Appetite has improved with weightgain. 8. Right Greater Trochanteric Bursitis: Scheduled Cortisone Injection with Dr. Wynn BankerKirsteins  9. Hereditary and Idiopathic Peripheral Neuropathy: RX: Effexor  20 minutes of face to face patient care time was spent during this visit. All questions were encouraged and answered.   F/U in 1 month

## 2016-12-28 ENCOUNTER — Encounter: Payer: Self-pay | Admitting: Registered Nurse

## 2016-12-28 ENCOUNTER — Encounter: Payer: Medicare Other | Attending: Physical Medicine & Rehabilitation | Admitting: Registered Nurse

## 2016-12-28 VITALS — BP 121/67 | HR 60 | Resp 14

## 2016-12-28 DIAGNOSIS — G894 Chronic pain syndrome: Secondary | ICD-10-CM

## 2016-12-28 DIAGNOSIS — Z79899 Other long term (current) drug therapy: Secondary | ICD-10-CM | POA: Diagnosis not present

## 2016-12-28 DIAGNOSIS — M542 Cervicalgia: Secondary | ICD-10-CM | POA: Diagnosis not present

## 2016-12-28 DIAGNOSIS — Z5181 Encounter for therapeutic drug level monitoring: Secondary | ICD-10-CM | POA: Diagnosis not present

## 2016-12-28 DIAGNOSIS — M48061 Spinal stenosis, lumbar region without neurogenic claudication: Secondary | ICD-10-CM | POA: Diagnosis not present

## 2016-12-28 DIAGNOSIS — M353 Polymyalgia rheumatica: Secondary | ICD-10-CM | POA: Diagnosis not present

## 2016-12-28 MED ORDER — VENLAFAXINE HCL 25 MG PO TABS
ORAL_TABLET | ORAL | 1 refills | Status: DC
Start: 2016-12-28 — End: 2017-05-17

## 2016-12-28 MED ORDER — VENLAFAXINE HCL 25 MG PO TABS: ORAL_TABLET | ORAL | 1 refills | Status: DC

## 2016-12-28 MED ORDER — HYDROCODONE-ACETAMINOPHEN 5-325 MG PO TABS: 1.0000 | ORAL_TABLET | Freq: Three times a day (TID) | ORAL | 0 refills | Status: DC | PRN

## 2016-12-28 NOTE — Progress Notes (Signed)
Subjective:    Patient ID: [name], female    DOB: December 10, 1945, 71 y.o.   MRN: [id number]  HPI: [name] is a 71 year old female who returns for follow up appointment for chronic pain and medication refill. She states her painis located in her neck radiating into her left shoulder.  She rates her pain 5. Her current exercise regime is walking short distances in her home, encouraged to continue toincrease her activity as tolerated.   Daughter in room.  Pain Inventory Average Pain 6 Pain Right Now 5 My pain is constant, sharp, dull, tingling and aching  In the last 24 hours, has pain interfered with the following? General activity 5 Relation with others 3 Enjoyment of life 5 What TIME of day is your pain at its worst? morning, evening Sleep (in general) Good  Pain is worse with: walking, bending, standing and some activites Pain improves with: rest, heat/ice, pacing activities and medication Relief from Meds: 7  Mobility walk without assistance how many minutes can you walk? 15 ability to climb steps?  yes do you drive?  yes transfers alone  Function retired I need assistance with the following:  meal prep, household duties and shopping  Neuro/Psych weakness tingling spasms  Prior Studies Any changes since last visit?  no  Physicians involved in your care Any changes since last visit?  no   Family History  Problem Relation Age of Onset  . Heart disease Father   . Factor V Leiden deficiency Father   . Factor V Leiden deficiency Brother   . Factor V Leiden deficiency Daughter    Social History   Social History  . Marital status: Married    Spouse name: N/A  . Number of children: N/A  . Years of education: N/A   Social History Main Topics  . Smoking status: Current Some Day Smoker  . Smokeless tobacco: Never Used     Comment: Smokes e cigarettes  . Alcohol use No  . Drug use: Unknown  . Sexual activity: Not Asked   Other  Topics Concern  . None   Social History Narrative  . None   Past Surgical History:  Procedure Laterality Date  . ABDOMINAL HYSTERECTOMY    . BREAST SURGERY    . CATARACT EXTRACTION    . CHOLECYSTECTOMY     Past Medical History:  Diagnosis Date  . Arthritis   . Cataract   . COPD (chronic obstructive pulmonary disease) (HCC)   . Factor 5 Leiden mutation, heterozygous (HCC)   . Hypertension   . Myocardial infarction    BP 121/67   Pulse (!) 54   Resp 14   SpO2 94%   Opioid Risk Score:   Fall Risk Score:  `1  Depression screen PHQ 2/9  Depression screen [facility] 2/9 06/11/2016 05/04/2016 01/06/2016 12/06/2015 08/08/2015 07/11/2015 02/18/2015  Decreased Interest 0 0 3 3 3 3 3   Down, Depressed, Hopeless 0 0 3 3 3  0 0  PHQ - 2 Score 0 0 6 6 6 3 3   Altered sleeping - - - - - - 3  Tired, decreased energy - - - - - - 1  Change in appetite - - - - - - 0  Feeling bad or failure about yourself  - - - - - - 0  Trouble concentrating - - - - - - 0  Moving slowly or fidgety/restless - - - - - - 0  Suicidal thoughts - - - - - -  0  PHQ-9 Score - - - - - - 7   Review of Systems  HENT: Negative.   Eyes: Negative.   Respiratory: Positive for shortness of breath and wheezing.   Cardiovascular: Negative.   Gastrointestinal: Positive for abdominal pain, constipation and nausea.  Endocrine: Negative.   Genitourinary: Negative.   Musculoskeletal: Positive for arthralgias, back pain, myalgias and neck pain.  Skin: Negative.   Allergic/Immunologic: Negative.   Neurological: Positive for weakness.       Tingling  Hematological: Bruises/bleeds easily.  Psychiatric/Behavioral: Negative.   All other systems reviewed and are negative.      Objective:   Physical Exam  Constitutional: She is oriented to person, place, and time. She appears well-developed and well-nourished.  HENT:  Head: Normocephalic and atraumatic.  Neck: Normal range of motion. Neck supple.  Cervical Paraspinal Tenderness:  C-5-C-6  Cardiovascular: Normal rate and regular rhythm.   Pulmonary/Chest: Effort normal and breath sounds normal.  Continuous Oxygen 2 Liters nasal Cannula  Musculoskeletal:  Normal Muscle Bulk and Muscle Testing Reveals: Upper Extremities: Full ROM and Muscle Strength 5/5 Left AC Joint Tenderness Thoracic Paraspinal Tenderness: T-1-T-3 Lower Extremities: Full ROM and Muscle Strength 5/5 Arises from Table with Ease Narrow Based Gait   Neurological: She is alert and oriented to person, place, and time.  Skin: Skin is warm and dry.  Psychiatric: She has a normal mood and affect.  Nursing note and vitals reviewed.         Assessment & Plan:  1. History of lumbar spinal stenosis: 12/28/2016 Refilled: Hydrocodone 5/325mg  one tablet every 8 hours, may take an extra tablet when pain is severe.#95  2. Recent left occipital infarct, history of factor V 5 Leiden deficiency: On coumadin. Continue to monitor.12/28/2016 3. Fibromyalgia syndrome: Continue with exercise regime. 12/28/2016 4. Polymyalgia rheumatica: Rheumatology Following.12/28/2016 5. Chronic pain syndrome: Continue with exercise, heat, and ice therapy. 12/28/2016 6. Nicotine Dependence: Encouraged Smoking Cessation.12/28/2016 7. Hereditary and Idiopathic Peripheral Neuropathy: Continue Effexor.12/28/2016  20 minutes of face to face patient care time was spent during this visit. All questions were encouraged and answered.  F/U in 1 month

## 2017-01-25 ENCOUNTER — Encounter: Payer: Medicare Other | Admitting: Registered Nurse

## 2017-02-06 ENCOUNTER — Telehealth: Payer: Self-pay | Admitting: Registered Nurse

## 2017-02-06 ENCOUNTER — Encounter (INDEPENDENT_AMBULATORY_CARE_PROVIDER_SITE_OTHER): Payer: Self-pay

## 2017-02-06 ENCOUNTER — Encounter: Payer: Self-pay | Admitting: Registered Nurse

## 2017-02-06 ENCOUNTER — Encounter: Payer: Medicare Other | Attending: Physical Medicine & Rehabilitation | Admitting: Registered Nurse

## 2017-02-06 VITALS — BP 125/71 | HR 59 | Resp 14

## 2017-02-06 DIAGNOSIS — Z79899 Other long term (current) drug therapy: Secondary | ICD-10-CM | POA: Diagnosis not present

## 2017-02-06 DIAGNOSIS — M48061 Spinal stenosis, lumbar region without neurogenic claudication: Secondary | ICD-10-CM | POA: Diagnosis not present

## 2017-02-06 DIAGNOSIS — M542 Cervicalgia: Secondary | ICD-10-CM

## 2017-02-06 DIAGNOSIS — M25511 Pain in right shoulder: Secondary | ICD-10-CM

## 2017-02-06 DIAGNOSIS — G894 Chronic pain syndrome: Secondary | ICD-10-CM | POA: Diagnosis not present

## 2017-02-06 DIAGNOSIS — Z5181 Encounter for therapeutic drug level monitoring: Secondary | ICD-10-CM | POA: Diagnosis not present

## 2017-02-06 DIAGNOSIS — M353 Polymyalgia rheumatica: Secondary | ICD-10-CM | POA: Diagnosis not present

## 2017-02-06 MED ORDER — HYDROCODONE-ACETAMINOPHEN 5-325 MG PO TABS: 1.0000 | ORAL_TABLET | Freq: Three times a day (TID) | ORAL | 0 refills | Status: DC | PRN

## 2017-02-06 NOTE — Telephone Encounter (Signed)
On 02/06/2017 the NCCSR was reviewed no conflict was seen [facility] the [facility] Reporting System with multiple prescribers. [name] has a signed narcotic contract with our office. If there were any discrepancies this would have been reported to her physician.

## 2017-02-06 NOTE — Progress Notes (Signed)
Subjective:    Patient ID: [name], female    DOB: June 16, 1946, 71 y.o.   MRN: [id number]  HPI: [name] is a 71 year old female who returns for follow up appointmentfor chronic pain and medication refill. She states her painis located in her neck radiating into her right shoulder and lower back pain.  She rates her pain 6. Her current exercise regime is walking short distances in her home, encouraged to continue toincrease her activity as tolerated.   [name] was evaluated by Dr. Gwenevere Abbot on 01/17/2017, she was diagnosed with Stricture of Sigmoid Colon. They are potentially considering Robotic Sigmoid Colectomy if cleared from her Cardiologist and Pulmonologist she states. Dr. Gwenevere Abbot note was reviewed.   Grand-Daughter in room.   Pain Inventory Average Pain 6 Pain Right Now 6 My pain is constant, dull, tingling and aching  In the last 24 hours, has pain interfered with the following? General activity 5 Relation with others 3 Enjoyment of life 5 What TIME of day is your pain at its worst? morning, evening  Sleep (in general) Good  Pain is worse with: walking, sitting and standing Pain improves with: rest, heat/ice, pacing activities and medication Relief from Meds: 10  Mobility walk without assistance how many minutes can you walk? 30 ability to climb steps?  yes do you drive?  yes transfers alone Do you have any goals in this area?  yes  Function retired I need assistance with the following:  meal prep, household duties and shopping Do you have any goals in this area?  yes  Neuro/Psych bladder control problems bowel control problems weakness tingling spasms anxiety  Prior Studies Any changes since last visit?  no  Physicians involved in your care Any changes since last visit?  no   Family History  Problem Relation Age of Onset  . Heart disease Father   . Factor V Leiden deficiency Father   . Factor V Leiden deficiency Brother     . Factor V Leiden deficiency Daughter    Social History   Social History  . Marital status: Married    Spouse name: N/A  . Number of children: N/A  . Years of education: N/A   Social History Main Topics  . Smoking status: Current Some Day Smoker  . Smokeless tobacco: Never Used     Comment: Smokes e cigarettes  . Alcohol use No  . Drug use: Unknown  . Sexual activity: Not Asked   Other Topics Concern  . None   Social History Narrative  . None   Past Surgical History:  Procedure Laterality Date  . ABDOMINAL HYSTERECTOMY    . BREAST SURGERY    . CATARACT EXTRACTION    . CHOLECYSTECTOMY     Past Medical History:  Diagnosis Date  . Arthritis   . Cataract   . COPD (chronic obstructive pulmonary disease) (HCC)   . Factor 5 Leiden mutation, heterozygous (HCC)   . Hypertension   . Myocardial infarction    BP 125/71   Pulse (!) 59   Resp 14   SpO2 94%   Opioid Risk Score:   Fall Risk Score:  `1  Depression screen PHQ 2/9  Depression screen [facility] 2/9 06/11/2016 05/04/2016 01/06/2016 12/06/2015 08/08/2015 07/11/2015 02/18/2015  Decreased Interest 0 0 3 3 3 3 3   Down, Depressed, Hopeless 0 0 3 3 3  0 0  PHQ - 2 Score 0 0 6 6 6 3 3   Altered sleeping - - - - - -  3  Tired, decreased energy - - - - - - 1  Change in appetite - - - - - - 0  Feeling bad or failure about yourself  - - - - - - 0  Trouble concentrating - - - - - - 0  Moving slowly or fidgety/restless - - - - - - 0  Suicidal thoughts - - - - - - 0  PHQ-9 Score - - - - - - 7    Review of Systems  Constitutional: Positive for appetite change.  HENT: Negative.   Eyes: Negative.   Respiratory: Positive for shortness of breath and wheezing.   Gastrointestinal: Positive for constipation and nausea.  Endocrine: Negative.   Genitourinary: Positive for difficulty urinating.  Musculoskeletal: Positive for back pain and neck pain.       Spasms  Skin: Negative.   Allergic/Immunologic: Negative.   Neurological:  Positive for weakness.       Tingling  Hematological: Bruises/bleeds easily.  Psychiatric/Behavioral: The patient is nervous/anxious.   All other systems reviewed and are negative.      Objective:   Physical Exam  Constitutional: She is oriented to person, place, and time. She appears well-developed and well-nourished.  HENT:  Head: Normocephalic and atraumatic.  Neck: Normal range of motion. Neck supple.  Cardiovascular: Normal rate and regular rhythm.   Pulmonary/Chest: Effort normal and breath sounds normal.  Continuous Oxygen 3 Liters Nasal Cannula  Musculoskeletal:  Normal Muscle Bulk and Muscle Testing Reveals: Upper Extremities: Full ROM and Muscle Strength 5/5 Right AC Joint Tenderness Thoracic Paraspinal Tenderness: T-1-T-3 Mainly Right Side Lumbar Paraspinal Tenderness: L-4-L-5 Lower Extremities: Full ROM and Muscle Strength 5/5 Arises from Table with ease Narrow Based Gait  Neurological: She is alert and oriented to person, place, and time.  Skin: Skin is warm and dry.  Psychiatric: She has a normal mood and affect.  Nursing note and vitals reviewed.         Assessment & Plan:  1. History of lumbar spinal stenosis: 02/06/2017 Refilled: Hydrocodone 5/325mg  one tablet every 8 hours, may take an extra tablet when pain is severe.#95  2. Recent left occipital infarct, history of factor V 5 Leiden deficiency: On coumadin. Continue to monitor.02/06/2017 3. Fibromyalgia syndrome: Continue with exercise regime. 02/06/2017 4. Polymyalgia rheumatica: Rheumatology Following.02/06/2017 5. Chronic pain syndrome: Continue with exercise, heat, and ice therapy. 02/06/2017 6. Nicotine Dependence: Encouraged Smoking Cessation.02/06/2017 7. Hereditary and Idiopathic Peripheral Neuropathy: Continue Effexor.02/06/2017  15  minutes of face to face patient care time was spent during this visit. All questions were encouraged and answered.   F/U in 1 month

## 2017-03-08 ENCOUNTER — Encounter: Payer: Medicare Other | Attending: Physical Medicine & Rehabilitation | Admitting: Registered Nurse

## 2017-03-08 ENCOUNTER — Encounter: Payer: Self-pay | Admitting: Registered Nurse

## 2017-03-08 VITALS — BP 153/54 | HR 60

## 2017-03-08 DIAGNOSIS — G609 Hereditary and idiopathic neuropathy, unspecified: Secondary | ICD-10-CM

## 2017-03-08 DIAGNOSIS — M48061 Spinal stenosis, lumbar region without neurogenic claudication: Secondary | ICD-10-CM | POA: Diagnosis not present

## 2017-03-08 DIAGNOSIS — Z79899 Other long term (current) drug therapy: Secondary | ICD-10-CM | POA: Diagnosis not present

## 2017-03-08 DIAGNOSIS — G894 Chronic pain syndrome: Secondary | ICD-10-CM

## 2017-03-08 DIAGNOSIS — M353 Polymyalgia rheumatica: Secondary | ICD-10-CM | POA: Diagnosis not present

## 2017-03-08 DIAGNOSIS — M25511 Pain in right shoulder: Secondary | ICD-10-CM | POA: Diagnosis not present

## 2017-03-08 DIAGNOSIS — M7061 Trochanteric bursitis, right hip: Secondary | ICD-10-CM | POA: Diagnosis not present

## 2017-03-08 DIAGNOSIS — Z5181 Encounter for therapeutic drug level monitoring: Secondary | ICD-10-CM | POA: Diagnosis not present

## 2017-03-08 MED ORDER — HYDROCODONE-ACETAMINOPHEN 5-325 MG PO TABS: 1.0000 | ORAL_TABLET | Freq: Three times a day (TID) | ORAL | 0 refills | Status: DC | PRN

## 2017-03-08 NOTE — Progress Notes (Signed)
Subjective:    Patient ID: [name], female    DOB: 31-Mar-1946, 71 y.o.   MRN: [id number]  HPI: [name] is a 71 year old female who returns for follow up appointmentfor chronic pain and medication refill. She states her painis located in her right shoulder, right hip and lower back pain. She rates her pain 6. Her current exercise regime is walking short distances in her home, encouraged to continue toincrease her activity as tolerated.   [name] wasn't cleared for surgery her daughter states, per Cardiology and Pulmonology.  Her last UDS was on 09/24/2016 it was consistent, she's scheduled for UDS today.   Pain Inventory Average Pain 5 Pain Right Now 6 My pain is constant, dull and aching  In the last 24 hours, has pain interfered with the following? General activity 6 Relation with others 3 Enjoyment of life 5 What TIME of day is your pain at its worst? morning daytime and evening Sleep (in general) Fair  Pain is worse with: walking, bending, standing and some activites Pain improves with: rest, heat/ice, pacing activities and medication Relief from Meds: 6  Mobility walk without assistance ability to climb steps?  yes  Function retired I need assistance with the following:  meal prep, household duties and shopping  Neuro/Psych weakness tingling trouble walking anxiety  Prior Studies Any changes since last visit?  no  Physicians involved in your care Any changes since last visit?  no   Family History  Problem Relation Age of Onset  . Heart disease Father   . Factor V Leiden deficiency Father   . Factor V Leiden deficiency Brother   . Factor V Leiden deficiency Daughter    Social History   Social History  . Marital status: Married    Spouse name: N/A  . Number of children: N/A  . Years of education: N/A   Social History Main Topics  . Smoking status: Current Some Day Smoker  . Smokeless tobacco: Never Used     Comment:  Smokes e cigarettes  . Alcohol use No  . Drug use: Unknown  . Sexual activity: Not Asked   Other Topics Concern  . None   Social History Narrative  . None   Past Surgical History:  Procedure Laterality Date  . ABDOMINAL HYSTERECTOMY    . BREAST SURGERY    . CATARACT EXTRACTION    . CHOLECYSTECTOMY     Past Medical History:  Diagnosis Date  . Arthritis   . Cataract   . COPD (chronic obstructive pulmonary disease) (HCC)   . Factor 5 Leiden mutation, heterozygous (HCC)   . Hypertension   . Myocardial infarction (HCC)    BP (!) 153/54   Pulse 60   SpO2 94%   Opioid Risk Score:   Fall Risk Score:  `1  Depression screen PHQ 2/9  Depression screen [facility] 2/9 03/08/2017 06/11/2016 05/04/2016 01/06/2016 12/06/2015 08/08/2015 07/11/2015  Decreased Interest 0 0 0 Down, Depressed, Hopeless 0 0 0 0  PHQ - 2 Score 0 0 0 Altered sleeping - - - - - - -  Tired, decreased energy - - - - - - -  Change in appetite - - - - - - -  Feeling bad or failure about yourself  - - - - - - -  Trouble concentrating - - - - - - -  Moving slowly or fidgety/restless - - - - - - -  Suicidal thoughts - - - - - - -  PHQ-9 Score - - - - - - -    Review of Systems  Constitutional: Positive for appetite change and unexpected weight change.  HENT: Negative.   Eyes: Negative.   Respiratory: Positive for shortness of breath and wheezing.   Cardiovascular: Negative.   Gastrointestinal: Positive for abdominal pain and constipation.  Endocrine: Negative.   Genitourinary: Negative.   Musculoskeletal: Positive for gait problem.  Skin: Negative.   Allergic/Immunologic: Negative.   Neurological: Positive for weakness.  Hematological: Bruises/bleeds easily.  Psychiatric/Behavioral: The patient is nervous/anxious.   All other systems reviewed and are negative.      Objective:   Physical Exam  Constitutional: She is oriented to person, place, and time. She appears well-developed and  well-nourished.  HENT:  Head: Normocephalic and atraumatic.  Neck: Normal range of motion. Neck supple.  Cardiovascular: Normal rate and regular rhythm.   Pulmonary/Chest: Effort normal and breath sounds normal.  Continuous Oxygen 3 Liters Nasal Cannula  Musculoskeletal:  Normal Muscle Bulk and Muscle Testing Reveals: Upper Extremities: Full ROM and Muscle Strength 5/5 Lumbar Paraspinal Tenderness: L-3-L-5 Lower Extremities: Full ROM and Muscle Strength 5/5 Arises from Table with ease Narrow Based Gait  Neurological: She is alert and oriented to person, place, and time.  Skin: Skin is warm and dry.  Psychiatric: She has a normal mood and affect.  Nursing note and vitals reviewed.         Assessment & Plan:  1. History of lumbar spinal stenosis: 03/08/2017 Refilled: Hydrocodone 5/325mg  one tablet every 8 hours, may take an extra tablet when pain is severe.#95  2. Recent left occipital infarct, history of factor V 5 Leiden deficiency: On coumadin. Continue to monitor.03/08/2017 3. Fibromyalgia syndrome: Continue with exercise regime. 03/08/2017 4. Polymyalgia rheumatica: Rheumatology Following.03/08/2017 5. Chronic pain syndrome: Continue with exercise, heat, and ice therapy. 03/08/2017 6. Nicotine Dependence: Encouraged Smoking Cessation.03/08/2017 7. Hereditary and Idiopathic Peripheral Neuropathy: Continue Effexor.03/08/2017 8. Right Shoulder Pain: Continue HEP as tolerated and current medication regime.  9. Right Greater Trochanteric Bursitis: Continue with Ice/Heat Therapy. Continue to Monitor. Denies steroid injection at this time.   20 minutes of face to face patient care time was spent during this visit. All questions were encouraged and answered.   F/U in 1 month

## 2017-03-14 LAB — TOXASSURE SELECT,+ANTIDEPR,UR

## 2017-03-15 ENCOUNTER — Telehealth: Payer: Self-pay | Admitting: *Deleted

## 2017-03-15 NOTE — Telephone Encounter (Signed)
Urine drug screen for this encounter is consistent for prescribed medication. Hydrocodone and metabolites and venlafaxine are prescribed and expected.

## 2017-04-12 ENCOUNTER — Ambulatory Visit (HOSPITAL_BASED_OUTPATIENT_CLINIC_OR_DEPARTMENT_OTHER): Payer: Medicare Other | Admitting: Physical Medicine & Rehabilitation

## 2017-04-12 ENCOUNTER — Encounter: Payer: Medicare Other | Attending: Physical Medicine & Rehabilitation

## 2017-04-12 ENCOUNTER — Encounter: Payer: Self-pay | Admitting: Physical Medicine & Rehabilitation

## 2017-04-12 VITALS — BP 124/57 | HR 63

## 2017-04-12 DIAGNOSIS — M353 Polymyalgia rheumatica: Secondary | ICD-10-CM | POA: Insufficient documentation

## 2017-04-12 DIAGNOSIS — G894 Chronic pain syndrome: Secondary | ICD-10-CM | POA: Diagnosis not present

## 2017-04-12 DIAGNOSIS — M48061 Spinal stenosis, lumbar region without neurogenic claudication: Secondary | ICD-10-CM | POA: Diagnosis not present

## 2017-04-12 DIAGNOSIS — Z79899 Other long term (current) drug therapy: Secondary | ICD-10-CM | POA: Insufficient documentation

## 2017-04-12 DIAGNOSIS — Z5181 Encounter for therapeutic drug level monitoring: Secondary | ICD-10-CM | POA: Diagnosis not present

## 2017-04-12 MED ORDER — HYDROCODONE-ACETAMINOPHEN 5-325 MG PO TABS: 1.0000 | ORAL_TABLET | Freq: Three times a day (TID) | ORAL | 0 refills | Status: DC | PRN

## 2017-04-12 NOTE — Progress Notes (Signed)
Subjective:    Patient ID: [name], female    DOB: Mar 30, 1946, 71 y.o.   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 Takes 2-3 hydrocodone per day  No longer taking Tizanidine  Remains independent with self care and mobility  Pain Inventory Average Pain 5 Pain Right Now 5 My pain is dull, tingling and aching  In the last 24 hours, has pain interfered with the following? General activity 5 Relation with others 3 Enjoyment of life 5 What TIME of day is your pain at its worst? daytime Sleep (in general) Good  Pain is worse with: walking, bending, sitting and standing Pain improves with: rest, heat/ice, pacing activities and medication Relief from Meds: 6  Mobility walk without assistance ability to climb steps?  yes do you drive?  yes  Function I need assistance with the following:  meal prep, household duties and shopping  Neuro/Psych bladder control problems weakness trouble walking anxiety  Prior Studies Any changes since last visit?  no  Physicians involved in your care Any changes since last visit?  no   Family History  Problem Relation Age of Onset  . Heart disease Father   . Factor V Leiden deficiency Father   . Factor V Leiden deficiency Brother   . Factor V Leiden deficiency Daughter    Social History   Social History  . Marital status: Married    Spouse name: N/A  . Number of children: N/A  . Years of education: N/A   Social History Main Topics  . Smoking status: Current Some Day Smoker  . Smokeless tobacco: Never Used     Comment: Smokes e cigarettes  . Alcohol use No  . Drug use: Unknown  . Sexual activity: Not on file   Other Topics Concern  . Not on file   Social History Narrative  . No narrative on file   Past Surgical History:  Procedure Laterality Date  . ABDOMINAL HYSTERECTOMY    . BREAST  SURGERY    . CATARACT EXTRACTION    . CHOLECYSTECTOMY     Past Medical History:  Diagnosis Date  . Arthritis   . Cataract   . COPD (chronic obstructive pulmonary disease) (HCC)   . Factor 5 Leiden mutation, heterozygous (HCC)   . Hypertension   . Myocardial infarction (HCC)    There were no vitals taken for this visit.  Opioid Risk Score:   Fall Risk Score:  `1  Depression screen PHQ 2/9  [facility] 03/08/2017 06/11/2016 05/04/2016 01/06/2016 12/06/2015 08/08/2015 07/11/2015  Decreased Interest 0 0 0 3 3 3 3   Down, Depressed, Hopeless 0 0 0 3 3 3  0  PHQ - 2 Score 0 0 0 6 6 6 3   Altered sleeping - - - - - - -  Tired, decreased energy - - - - - - -  Change in appetite - - - - - - -  Feeling bad or failure about yourself  - - - - - - -  Trouble concentrating - - - - - - -  Moving slowly or fidgety/restless - - - - - - -  Suicidal thoughts - - - - - - -  PHQ-9 Score - - - - - - -    Review of Systems  Constitutional: Positive for appetite change.  HENT: Negative.   Eyes: Negative.   Respiratory: Positive  for shortness of breath.   Cardiovascular: Negative.   Gastrointestinal: Positive for abdominal pain and constipation.  Endocrine: Negative.   Genitourinary: Negative.   Musculoskeletal: Negative.   Skin: Negative.   Allergic/Immunologic: Negative.   Neurological: Negative.   Hematological: Bruises/bleeds easily.  Psychiatric/Behavioral: Negative.        Objective:   Physical Exam  Constitutional: She is oriented to person, place, and time. She appears well-developed and well-nourished.  HENT:  Head: Normocephalic and atraumatic.  Eyes: Conjunctivae and EOM are normal. Pupils are equal, round, and reactive to light.  Musculoskeletal:       Lumbar back: She exhibits normal range of motion, no deformity and no spasm.  No lumbar spine pain to palpation along the paraspinal muscle groups.  Neurological: She is alert and oriented to person, place, and time.  Gait abnormal.  Sensation intact to light touch bilateral lower extremities Ambulates while carrying her oxygen  Psychiatric: She has a normal mood and affect.  Nursing note and vitals reviewed.         Assessment & Plan:  1. History of lumbar spinal stenosis: No evidence of radiculopathy Refilled: Hydrocodone 5/325mg  one tablet 2-3 times a day  Recent toxicology April 2018 was appropriate. She has had prescription from PCP for alprazolam taking 0.5mg  im am 0.25mg  in pm Continue opioid monitoring program. This consists of regular clinic visits, examinations, urine drug screen, pill counts as well as use of [place] controlled substance reporting System. RTC 52mo 2. Hx left occipital infarct, history of factor V 5 Leiden deficiency: On coumadin. [facility] [place] and cleared for driving by [employer] short distances  3. Fibromyalgia syndrome: Continue with exercise regime.  4. Polymyalgia rheumatica: Rheumatology Following,low dose prednisone 5. Chronic pain syndrome: Continue with exercise, heat, and ice therapy. 6. Nicotine Dependence: Encouraged Smoking Cessation.0.5ppd 7. Neuropathic pain on Effexor 8.  COPD O2 dependent

## 2017-05-17 ENCOUNTER — Encounter: Payer: Self-pay | Admitting: Registered Nurse

## 2017-05-17 ENCOUNTER — Encounter: Payer: Medicare Other | Attending: Physical Medicine & Rehabilitation | Admitting: Registered Nurse

## 2017-05-17 VITALS — BP 137/66 | HR 56

## 2017-05-17 DIAGNOSIS — M7061 Trochanteric bursitis, right hip: Secondary | ICD-10-CM | POA: Diagnosis not present

## 2017-05-17 DIAGNOSIS — M353 Polymyalgia rheumatica: Secondary | ICD-10-CM | POA: Insufficient documentation

## 2017-05-17 DIAGNOSIS — Z5181 Encounter for therapeutic drug level monitoring: Secondary | ICD-10-CM | POA: Insufficient documentation

## 2017-05-17 DIAGNOSIS — M48061 Spinal stenosis, lumbar region without neurogenic claudication: Secondary | ICD-10-CM | POA: Diagnosis not present

## 2017-05-17 DIAGNOSIS — Z79899 Other long term (current) drug therapy: Secondary | ICD-10-CM | POA: Diagnosis not present

## 2017-05-17 DIAGNOSIS — G609 Hereditary and idiopathic neuropathy, unspecified: Secondary | ICD-10-CM

## 2017-05-17 DIAGNOSIS — M25511 Pain in right shoulder: Secondary | ICD-10-CM | POA: Diagnosis not present

## 2017-05-17 DIAGNOSIS — G894 Chronic pain syndrome: Secondary | ICD-10-CM | POA: Insufficient documentation

## 2017-05-17 MED ORDER — HYDROCODONE-ACETAMINOPHEN 5-325 MG PO TABS: 1.0000 | ORAL_TABLET | Freq: Three times a day (TID) | ORAL | 0 refills | Status: DC | PRN

## 2017-05-17 NOTE — Progress Notes (Signed)
Subjective:    Patient ID: [name], female    DOB: July 27, 1946, 71 y.o.   MRN: [id number]  HPI:  [name] is a 71 year old female who returns for follow up appointmentfor chronic pain and medication refill. She states her painis located in her right shoulder, lower back pain, right hip and bilateral feet. States she was having Abdominal Pain went to [facility] notes reviewed, her Effexor was discontinued due to hepatic Panel abnormality daughter states, since the discontinuation of Effexor, [name] has been experiencing increase intensity of neuropathic pain. We will increase her hydrocodone tablets this month and re-assess next month. They verbalize understanding.  She rates her pain 6. Her current exercise regime is walking short distances in her home and performing stretching exercises.  Last UDS was performed on 03/08/2017, it was consistent.    Pain Inventory Average Pain 6 Pain Right Now 6 My pain is constant  In the last 24 hours, has pain interfered with the following? General activity 7 Relation with others 4 Enjoyment of life 7 What TIME of day is your pain at its worst? daytime Sleep (in general) Fair  Pain is worse with: walking, bending, sitting, standing and some activites Pain improves with: pacing activities and medication Relief from Meds: 5  Mobility walk without assistance ability to climb steps?  yes do you drive?  yes  Function I need assistance with the following:  bathing, meal prep, household duties and shopping  Neuro/Psych bladder control problems weakness tingling trouble walking confusion anxiety  Prior Studies Any changes since last visit?  no  Physicians involved in your care Any changes since last visit?  no   Family History  Problem Relation Age of Onset  . Heart disease Father   . Factor V Leiden deficiency Father   . Factor V Leiden deficiency Brother   . Factor V Leiden deficiency  Daughter    Social History   Social History  . Marital status: Married    Spouse name: N/A  . Number of children: N/A  . Years of education: N/A   Social History Main Topics  . Smoking status: Current Some Day Smoker  . Smokeless tobacco: Never Used     Comment: Smokes e cigarettes  . Alcohol use No  . Drug use: Unknown  . Sexual activity: Not on file   Other Topics Concern  . Not on file   Social History Narrative  . No narrative on file   Past Surgical History:  Procedure Laterality Date  . ABDOMINAL HYSTERECTOMY    . BREAST SURGERY    . CATARACT EXTRACTION    . CHOLECYSTECTOMY     Past Medical History:  Diagnosis Date  . Arthritis   . Cataract   . COPD (chronic obstructive pulmonary disease) (HCC)   . Factor 5 Leiden mutation, heterozygous (HCC)   . Hypertension   . Myocardial infarction (HCC)    There were no vitals taken for this visit.  Opioid Risk Score:   Fall Risk Score:  `1  Depression screen PHQ 2/9  Depression screen [facility] 2/9 03/08/2017 06/11/2016 05/04/2016 01/06/2016 12/06/2015 08/08/2015 07/11/2015  Decreased Interest 0 0 0 3 3 3 3   Down, Depressed, Hopeless 0 0 0 3 3 3  0  PHQ - 2 Score 0 0 0 6 6 6 3   Altered sleeping - - - - - - -  Tired, decreased energy - - - - - - -  Change in appetite - - - - - - -  Feeling bad or failure about yourself  - - - - - - -  Trouble concentrating - - - - - - -  Moving slowly or fidgety/restless - - - - - - -  Suicidal thoughts - - - - - - -  PHQ-9 Score - - - - - - -     Review of Systems  Constitutional: Positive for unexpected weight change.  HENT: Negative.   Eyes: Negative.   Respiratory: Positive for wheezing.   Cardiovascular: Negative.   Gastrointestinal: Positive for abdominal pain, constipation and nausea.  Endocrine: Negative.   Genitourinary: Negative.   Musculoskeletal: Negative.   Skin: Negative.   Allergic/Immunologic: Negative.   Neurological: Negative.   Hematological: Bruises/bleeds  easily.  Psychiatric/Behavioral: Negative.   All other systems reviewed and are negative.      Objective:   Physical Exam  Constitutional: She is oriented to person, place, and time. She appears well-developed and well-nourished.  HENT:  Head: Normocephalic and atraumatic.  Neck: Normal range of motion. Neck supple.  Cardiovascular: Normal rate and regular rhythm.   Pulmonary/Chest: Effort normal and breath sounds normal.  Continuous Oxygen 3 liters nasal cannula  Musculoskeletal:  Normal Muscle Bulk and Muscle Testing Reveals: Upper Extremities: Decreased ROM 90 Degrees and Muscle Strength 4/5 Left: Full ROM and Muscle Strength 5/5 Back without spinal tenderness noted Lower Extremities: Full ROM and Muscle Strength 5/5 Arises from Table slowly Narrow Based gait   Neurological: She is alert and oriented to person, place, and time.  Skin: Skin is warm and dry.  Psychiatric: She has a normal mood and affect.  Nursing note and vitals reviewed.         Assessment & Plan:  1. History of lumbar spinal stenosis: 05/17/2017 Refilled:Increased  Hydrocodone 5/325mg  one tablet every 8 hours, may take an extra tablet when pain is severe.#85  2. Recent left occipital infarct, history of factor V 5 Leiden deficiency: On coumadin. Continue to monitor.05/17/2017 3. Fibromyalgia syndrome: Continue with exercise regime. 05/17/2017 4. Polymyalgia rheumatica: Rheumatology Following.05/17/2017 5. Chronic pain syndrome: Continue with exercise, heat, and ice therapy. 05/17/2017 6. Nicotine Dependence: Encouraged Smoking Cessation.05/17/2017 7. Hereditary and Idiopathic Peripheral Neuropathy: Continue Effexor.05/17/2017 8. Right Shoulder Pain: Continue HEP as tolerated and current medication regime. 05/17/2017 9. Right Greater Trochanteric Bursitis: Continue with Ice/Heat Therapy. Continue to Monitor. 05/17/2017.  20 minutes of face to face patient care time was spent during this visit.  All questions were encouraged and answered.  F/U in 1 month

## 2017-06-13 ENCOUNTER — Encounter: Payer: Self-pay | Admitting: Registered Nurse

## 2017-06-13 ENCOUNTER — Encounter: Payer: Medicare Other | Attending: Physical Medicine & Rehabilitation | Admitting: Registered Nurse

## 2017-06-13 VITALS — BP 147/69 | HR 69

## 2017-06-13 DIAGNOSIS — M353 Polymyalgia rheumatica: Secondary | ICD-10-CM | POA: Diagnosis not present

## 2017-06-13 DIAGNOSIS — M25511 Pain in right shoulder: Secondary | ICD-10-CM | POA: Diagnosis not present

## 2017-06-13 DIAGNOSIS — Z5181 Encounter for therapeutic drug level monitoring: Secondary | ICD-10-CM | POA: Insufficient documentation

## 2017-06-13 DIAGNOSIS — Z79899 Other long term (current) drug therapy: Secondary | ICD-10-CM | POA: Insufficient documentation

## 2017-06-13 DIAGNOSIS — M7061 Trochanteric bursitis, right hip: Secondary | ICD-10-CM | POA: Diagnosis not present

## 2017-06-13 DIAGNOSIS — M48061 Spinal stenosis, lumbar region without neurogenic claudication: Secondary | ICD-10-CM

## 2017-06-13 DIAGNOSIS — G609 Hereditary and idiopathic neuropathy, unspecified: Secondary | ICD-10-CM

## 2017-06-13 DIAGNOSIS — G894 Chronic pain syndrome: Secondary | ICD-10-CM

## 2017-06-13 MED ORDER — HYDROCODONE-ACETAMINOPHEN 5-325 MG PO TABS: 1.0000 | ORAL_TABLET | Freq: Three times a day (TID) | ORAL | 0 refills | Status: DC | PRN

## 2017-06-13 NOTE — Progress Notes (Signed)
Subjective:    Patient ID: [name], female    DOB: 1946/09/22, 71 y.o.   MRN: [id number]  HPI: [name] is a 71 year old female who returns for follow up appointmentfor chronic pain and medication refill. She states her painis located in her right shoulder and right hip. She rates her pain 6. Her current exercise regime is walking short distances in her home and using light weights.   Last UDS was performed on 03/08/2017, it was consistent.    Pain Inventory Average Pain 6 Pain Right Now 6 My pain is dull, stabbing, tingling and aching  In the last 24 hours, has pain interfered with the following? General activity 5 Relation with others 3 Enjoyment of life 5 What TIME of day is your pain at its worst? morning and evening Sleep (in general) Good  Pain is worse with: walking, bending and standing Pain improves with: rest, heat/ice and medication Relief from Meds: n/a  Mobility walk without assistance ability to climb steps?  yes do you drive?  yes  Function retired I need assistance with the following:  meal prep, household duties and shopping  Neuro/Psych bladder control problems weakness numbness tingling trouble walking spasms anxiety  Prior Studies Any changes since last visit?  no  Physicians involved in your care Any changes since last visit?  no   Family History  Problem Relation Age of Onset  . Heart disease Father   . Factor V Leiden deficiency Father   . Factor V Leiden deficiency Brother   . Factor V Leiden deficiency Daughter    Social History   Social History  . Marital status: Married    Spouse name: N/A  . Number of children: N/A  . Years of education: N/A   Social History Main Topics  . Smoking status: Current Some Day Smoker  . Smokeless tobacco: Never Used     Comment: Smokes e cigarettes  . Alcohol use No  . Drug use: Unknown  . Sexual activity: Not Asked   Other Topics Concern  . None   Social  History Narrative  . None   Past Surgical History:  Procedure Laterality Date  . ABDOMINAL HYSTERECTOMY    . BREAST SURGERY    . CATARACT EXTRACTION    . CHOLECYSTECTOMY     Past Medical History:  Diagnosis Date  . Arthritis   . Cataract   . COPD (chronic obstructive pulmonary disease) (HCC)   . Factor 5 Leiden mutation, heterozygous (HCC)   . Hypertension   . Myocardial infarction (HCC)    BP (!) 147/69   Pulse 69   SpO2 92% Comment: room air  Opioid Risk Score:  0 Fall Risk Score:  `1  Depression screen PHQ 2/9  [facility] 06/13/2017 03/08/2017 06/11/2016 05/04/2016 01/06/2016 12/06/2015 08/08/2015  Decreased Interest 0 0 0 0 3 3 3   Down, Depressed, Hopeless 0 0 0 0 3 3 3   PHQ - 2 Score 0 0 0 0 6 6 6   Altered sleeping - - - - - - -  Tired, decreased energy - - - - - - -  Change in appetite - - - - - - -  Feeling bad or failure about yourself  - - - - - - -  Trouble concentrating - - - - - - -  Moving slowly or fidgety/restless - - - - - - -  Suicidal thoughts - - - - - - -  PHQ-9 Score - - - - - - -  Review of Systems  Constitutional: Positive for appetite change and unexpected weight change.  HENT: Negative.   Eyes: Negative.   Respiratory: Positive for shortness of breath.   Cardiovascular: Negative.   Gastrointestinal: Positive for abdominal pain and constipation.  Endocrine: Negative.   Genitourinary:       Bladder control  Musculoskeletal: Positive for gait problem.       Spasms  Skin: Negative.   Allergic/Immunologic: Negative.   Neurological: Positive for weakness and numbness.       Tingling  Hematological: Bruises/bleeds easily.  Psychiatric/Behavioral: The patient is nervous/anxious.   All other systems reviewed and are negative.      Objective:   Physical Exam  Constitutional: She is oriented to person, place, and time. She appears well-developed and well-nourished.  HENT:  Head: Normocephalic and atraumatic.  Neck: Normal  range of motion. Neck supple.  Cardiovascular: Normal rate and regular rhythm.   Pulmonary/Chest: Effort normal and breath sounds normal.  Musculoskeletal:  Normal Muscle Bulk and Muscle Testing Reveals: Upper Extremities: Full ROM and Muscle Strength 5/5 Right: AC Joint Tenderness Back without spinal Tenderness Right Greater Trochanter Tenderness Lower Extremities: Full ROM and Muscle Strength 5/5 Arises from Table with ease Narrow Based Gait  Neurological: She is alert and oriented to person, place, and time.  Skin: Skin is warm and dry.  Psychiatric: She has a normal mood and affect.  Nursing note and vitals reviewed.         Assessment & Plan:  1. History of lumbar spinal stenosis: 06/13/2017 Refilled:  Hydrocodone 5/325mg  one tablet every 8 hours, may take an extra tablet when pain is severe.#85  2. Recent left occipital infarct, history of factor V 5 Leiden deficiency: On coumadin. Continue to monitor.06/13/2017 3. Fibromyalgia syndrome: Continue with exercise regime. 06/13/2017 4. Polymyalgia rheumatica: Rheumatology Following.06/13/2017 5. Chronic pain syndrome: Continue with exercise, heat, and ice therapy. 06/13/2017 6. Nicotine Dependence: Encouraged Smoking Cessation.06/13/2017 7. Hereditary and Idiopathic Peripheral Neuropathy: Continue to Monitor, Effexor discontinued due to elevated Liver Profile per PCP.05/17/2017 8. Right Shoulder Pain: Refuses X-ray, considering Cortisone injection, would like to discuss with Dr. Wynn BankerKirsteins next month. Continue HEP as tolerated and current medication regime. 06/13/2017 9. Right Greater Trochanteric Bursitis: Scheduled for Cortisone Injection with Dr. Wynn BankerKirsteins.Continue with Ice/Heat Therapy. Schedule for Cortisone InjectionContinue to Monitor. 06/13/2017.  20  minutes of face to face patient care time was spent during this visit. All questions were encouraged and answered.   F/U in 1 month

## 2017-07-08 ENCOUNTER — Ambulatory Visit: Payer: Medicare Other | Admitting: Physical Medicine & Rehabilitation

## 2017-07-12 ENCOUNTER — Ambulatory Visit: Payer: Medicare Other | Admitting: Physical Medicine & Rehabilitation

## 2017-07-25 ENCOUNTER — Encounter: Payer: Self-pay | Admitting: Registered Nurse

## 2017-07-25 ENCOUNTER — Encounter: Payer: Medicare Other | Attending: Physical Medicine & Rehabilitation | Admitting: Registered Nurse

## 2017-07-25 ENCOUNTER — Telehealth: Payer: Self-pay | Admitting: Registered Nurse

## 2017-07-25 VITALS — BP 132/63 | HR 66

## 2017-07-25 DIAGNOSIS — G894 Chronic pain syndrome: Secondary | ICD-10-CM | POA: Insufficient documentation

## 2017-07-25 DIAGNOSIS — Z79899 Other long term (current) drug therapy: Secondary | ICD-10-CM | POA: Insufficient documentation

## 2017-07-25 DIAGNOSIS — Z5181 Encounter for therapeutic drug level monitoring: Secondary | ICD-10-CM | POA: Diagnosis not present

## 2017-07-25 DIAGNOSIS — M353 Polymyalgia rheumatica: Secondary | ICD-10-CM | POA: Insufficient documentation

## 2017-07-25 DIAGNOSIS — G609 Hereditary and idiopathic neuropathy, unspecified: Secondary | ICD-10-CM | POA: Diagnosis not present

## 2017-07-25 DIAGNOSIS — M7061 Trochanteric bursitis, right hip: Secondary | ICD-10-CM

## 2017-07-25 DIAGNOSIS — M48061 Spinal stenosis, lumbar region without neurogenic claudication: Secondary | ICD-10-CM | POA: Diagnosis not present

## 2017-07-25 DIAGNOSIS — M778 Other enthesopathies, not elsewhere classified: Secondary | ICD-10-CM

## 2017-07-25 DIAGNOSIS — M7581 Other shoulder lesions, right shoulder: Secondary | ICD-10-CM | POA: Diagnosis not present

## 2017-07-25 DIAGNOSIS — M25511 Pain in right shoulder: Secondary | ICD-10-CM

## 2017-07-25 MED ORDER — HYDROCODONE-ACETAMINOPHEN 5-325 MG PO TABS: 1.0000 | ORAL_TABLET | Freq: Three times a day (TID) | ORAL | 0 refills | Status: DC | PRN

## 2017-07-25 NOTE — Telephone Encounter (Signed)
On 07/25/2017 the NCCSR was reviewed no conflict was seen [facility] the [facility] System with multiple prescribers. [name] has a signed narcotic contract with our office. If there were any discrepancies this would have been reported to her physician.

## 2017-07-25 NOTE — Progress Notes (Signed)
Subjective:    Patient ID: [name], female    DOB: 1945/12/17, 72 y.o.   MRN: [id number]  HPI: [name] is a 71 year old female who returns for follow up appointmentfor chronic pain and medication refill. She states her painis located in her right shoulder, right hip and lower back. She rates her pain 6. Her current exercise regime is walking short distances in her home and using light weights.   Last UDS was performed on 03/08/2017, it was consistent.   Pain Inventory Average Pain 6 Pain Right Now 6 My pain is dull, tingling and aching  In the last 24 hours, has pain interfered with the following? General activity 6 Relation with others 3 Enjoyment of life 5 What TIME of day is your pain at its worst? morning and evening Sleep (in general) NA  Pain is worse with: walking, bending, standing and some activites Pain improves with: rest, heat/ice, pacing activities and medication Relief from Meds: 8  Mobility walk without assistance ability to climb steps?  yes do you drive?  yes  Function retired I need assistance with the following:  bathing, meal prep, household duties and shopping  Neuro/Psych bladder control problems weakness anxiety  Prior Studies Any changes since last visit?  no  Physicians involved in your care Any changes since last visit?  no   Family History  Problem Relation Age of Onset  . Heart disease Father   . Factor V Leiden deficiency Father   . Factor V Leiden deficiency Brother   . Factor V Leiden deficiency Daughter    Social History   Social History  . Marital status: Married    Spouse name: N/A  . Number of children: N/A  . Years of education: N/A   Social History Main Topics  . Smoking status: Current Some Day Smoker  . Smokeless tobacco: Never Used     Comment: Smokes e cigarettes  . Alcohol use No  . Drug use: Unknown  . Sexual activity: Not Asked   Other Topics Concern  . None   Social History  Narrative  . None   Past Surgical History:  Procedure Laterality Date  . ABDOMINAL HYSTERECTOMY    . BREAST SURGERY    . CATARACT EXTRACTION    . CHOLECYSTECTOMY     Past Medical History:  Diagnosis Date  . Arthritis   . Cataract   . COPD (chronic obstructive pulmonary disease) (HCC)   . Factor 5 Leiden mutation, heterozygous (HCC)   . Hypertension   . Myocardial infarction (HCC)    BP 132/63   Pulse 66   SpO2 93%   Opioid Risk Score:  0 Fall Risk Score:  `1  Depression screen PHQ 2/9  Depression screen [facility] 2/9 07/25/2017 06/13/2017 03/08/2017 06/11/2016 05/04/2016 01/06/2016 12/06/2015  Decreased Interest 0 0 0 0 0 3 3  Down, Depressed, Hopeless 0 0 0 0 0 3 3  PHQ - 2 Score 0 0 0 0 0 6 6  Altered sleeping - - - - - - -  Tired, decreased energy - - - - - - -  Change in appetite - - - - - - -  Feeling bad or failure about yourself  - - - - - - -  Trouble concentrating - - - - - - -  Moving slowly or fidgety/restless - - - - - - -  Suicidal thoughts - - - - - - -  PHQ-9 Score - - - - - - -  Review of Systems  Constitutional: Positive for appetite change and unexpected weight change.  HENT: Negative.   Eyes: Negative.   Respiratory: Positive for shortness of breath.   Gastrointestinal: Positive for abdominal pain, constipation and nausea.  Genitourinary: Negative.   Musculoskeletal: Negative.   Skin: Negative.   Allergic/Immunologic: Negative.   Neurological: Positive for weakness.  Hematological: Bruises/bleeds easily.  Psychiatric/Behavioral: The patient is nervous/anxious.   All other systems reviewed and are negative.      Objective:   Physical Exam  Constitutional: She is oriented to person, place, and time. She appears well-developed and well-nourished.  HENT:  Head: Normocephalic and atraumatic.  Neck: Normal range of motion. Neck supple.  Cardiovascular: Normal rate and regular rhythm.   Pulmonary/Chest: Effort normal and breath sounds normal.    Continuous Oxygen at 3 liters nasal cannula  Musculoskeletal:  Normal Muscle Bulk and Muscle Testing Reveals:  Upper Extremities: Right: Decreased ROM 90 Degrees and Muscle Strength 4/5  And Left:  Full ROM and Muscle Strength 5/5 Lumbar Paraspinal Tenderness: L-3-L-5 Lower Extremities: Full ROM and Muscle Strength 5/5 Arises from Table with Ease  Narrow Based Gait    Neurological: She is alert and oriented to person, place, and time.  Skin: Skin is warm and dry.  Psychiatric: She has a normal mood and affect.  Nursing note and vitals reviewed.         Assessment & Plan:  1. History of lumbar spinal stenosis: 07/25/2017 Refilled: Hydrocodone 5/325mg  one tablet every 8 hours, may take an extra tablet when pain is severe.#85  2. Recent left occipital infarct, history of factor V 5 Leiden deficiency: On coumadin. Continue to monitor.07/25/2017 3. Fibromyalgia syndrome: Continue with exercise regime. 07/25/2017 4. Polymyalgia rheumatica: Rheumatology Following.07/25/2017 5. Chronic pain syndrome: Continue with exercise, heat, and ice therapy. 07/25/2017 6. Nicotine Dependence: Encouraged Smoking Cessation.07/25/2017 7. Hereditary and Idiopathic Peripheral Neuropathy: Continue to Monitor, Effexor discontinued due to elevated Liver Profile per PCP.07/25/2017 8. Right Shoulder Pain/ Subacromial Tendonitis of Right Shoulder. [name] isconsidering Cortisone injection, with Dr. Wynn BankerKirsteins would like to discuss with Dr. Wynn BankerKirsteins next month. Continue HEP as tolerated and current medication regime. 07/25/2017 9. Right Greater Trochanteric Bursitis: Scheduled for Cortisone Injection with Dr. Wynn BankerKirsteins.Continue with Ice/Heat Therapy. Continue to Monitor. 07/25/2017.  20 minutes of face to face patient care time was spent during this visit. All questions were encouraged and answered.   F/U in 1 month

## 2017-08-23 ENCOUNTER — Encounter: Payer: Medicare Other | Attending: Physical Medicine & Rehabilitation

## 2017-08-23 ENCOUNTER — Encounter: Payer: Self-pay | Admitting: Physical Medicine & Rehabilitation

## 2017-08-23 ENCOUNTER — Ambulatory Visit (HOSPITAL_BASED_OUTPATIENT_CLINIC_OR_DEPARTMENT_OTHER): Payer: Medicare Other | Admitting: Physical Medicine & Rehabilitation

## 2017-08-23 VITALS — BP 144/74 | HR 62 | Resp 14

## 2017-08-23 DIAGNOSIS — M7061 Trochanteric bursitis, right hip: Secondary | ICD-10-CM

## 2017-08-23 DIAGNOSIS — Z5181 Encounter for therapeutic drug level monitoring: Secondary | ICD-10-CM | POA: Diagnosis not present

## 2017-08-23 DIAGNOSIS — G894 Chronic pain syndrome: Secondary | ICD-10-CM | POA: Diagnosis present

## 2017-08-23 DIAGNOSIS — M353 Polymyalgia rheumatica: Secondary | ICD-10-CM | POA: Insufficient documentation

## 2017-08-23 DIAGNOSIS — Z79899 Other long term (current) drug therapy: Secondary | ICD-10-CM | POA: Diagnosis not present

## 2017-08-23 MED ORDER — HYDROCODONE-ACETAMINOPHEN 5-325 MG PO TABS: 1.0000 | ORAL_TABLET | Freq: Three times a day (TID) | ORAL | 0 refills | Status: DC | PRN

## 2017-08-23 NOTE — Patient Instructions (Signed)
This injection can be done every 3 months as needed. Please let Riley Lam know if you'd  Like to repeat

## 2017-08-23 NOTE — Progress Notes (Signed)
   Trochanteric bursa injection without ultrasound guidance  Indication Right Trochanteric bursitis. Exam has tenderness over the greater trochanter of the hip. Pain has not responded to conservative care such as exercise therapy and oral medications. Pain interferes with sleep or with mobility Informed consent was obtained after describing risks and benefits of the procedure with the patient these include bleeding bruising and infection. Patient has signed written consent form. Patient placed in a lateral decubitus position with the affected hip superior. Point of maximal pain was palpated marked and prepped with Betadine and entered with a needle to bone contact. Needle slightly withdrawn then  of betamethasone with 4 cc 1% lidocaine were injected. Patient tolerated procedure well. Post procedure instructions given.

## 2017-09-20 ENCOUNTER — Encounter: Payer: Medicare Other | Admitting: Registered Nurse

## 2017-10-04 ENCOUNTER — Encounter: Payer: Medicare Other | Admitting: Registered Nurse

## 2017-10-09 ENCOUNTER — Telehealth: Payer: Self-pay | Admitting: Registered Nurse

## 2017-10-09 ENCOUNTER — Encounter: Payer: Medicare Other | Admitting: Registered Nurse

## 2017-10-09 MED ORDER — HYDROCODONE-ACETAMINOPHEN 5-325 MG PO TABS: 1.0000 | ORAL_TABLET | Freq: Three times a day (TID) | ORAL | 0 refills | Status: DC | PRN

## 2017-10-09 NOTE — Telephone Encounter (Signed)
Return Ms. Azucena FreedMissy Hayes ( [name]  Daughter) call, she reports [name] is sick. We will allow her to pick up her mother's prescription and she will reschedule her appointment.  According to PMP Aware site her Hydrocodone was picked up on 08/30/2017. Ms. Markus JarvisHayes verbalizes understanding regarding the above.   Only one script given.

## 2017-11-01 ENCOUNTER — Other Ambulatory Visit: Payer: Self-pay

## 2017-11-01 ENCOUNTER — Encounter: Payer: Medicare Other | Attending: Physical Medicine & Rehabilitation | Admitting: Registered Nurse

## 2017-11-01 ENCOUNTER — Encounter: Payer: Self-pay | Admitting: Registered Nurse

## 2017-11-01 VITALS — BP 153/75 | HR 67

## 2017-11-01 DIAGNOSIS — G894 Chronic pain syndrome: Secondary | ICD-10-CM

## 2017-11-01 DIAGNOSIS — Z79899 Other long term (current) drug therapy: Secondary | ICD-10-CM | POA: Insufficient documentation

## 2017-11-01 DIAGNOSIS — M353 Polymyalgia rheumatica: Secondary | ICD-10-CM | POA: Diagnosis not present

## 2017-11-01 DIAGNOSIS — Z5181 Encounter for therapeutic drug level monitoring: Secondary | ICD-10-CM | POA: Insufficient documentation

## 2017-11-01 DIAGNOSIS — M48061 Spinal stenosis, lumbar region without neurogenic claudication: Secondary | ICD-10-CM

## 2017-11-01 DIAGNOSIS — M5416 Radiculopathy, lumbar region: Secondary | ICD-10-CM

## 2017-11-01 MED ORDER — HYDROCODONE-ACETAMINOPHEN 5-325 MG PO TABS: 1.0000 | ORAL_TABLET | Freq: Three times a day (TID) | ORAL | 0 refills | Status: DC | PRN

## 2017-11-01 NOTE — Progress Notes (Signed)
Subjective:    Patient ID: [name], female    DOB: Apr 17, 1946, 71 y.o.   MRN: [id number]  HPI: Ms. Brandy EnsignDixie Donaldson is a 71 year old female who returns for follow up appointmentfor chronic pain and medication refill. She states her painis located in her lower back radiating into her right lower extremity and right foot pain with burning and tingling. She rates her pain 6. Her current exercise regime is walking short distances in her home and using light weights.   [name] Morphine equivalent is  14.66 MME.  She is also prescribed Alprazolam by Dr. Virginia Rochesterrr .We have discussed the black box warning of using opioids and benzodiazepines. I highlighted the dangers of using these drugs together and discussed the adverse events including respiratory suppression, overdose, cognitive impairment and importance of  compliance with current regimen. She verbalizes understanding, we will continue to monitor and adjust as indicated.     [name] was diagnosed with Pelvic Floor Dyssynergia: GI Dr. Fraser DinNixon following, she will be attending physical therapy for pelvic floor therapy reports her daughter.   Last UDS was performed on 03/08/2017, it was consistent.   Pain Inventory Average Pain 6 Pain Right Now 6 My pain is dull, tingling and aching  In the last 24 hours, has pain interfered with the following? General activity 6 Relation with others 3 Enjoyment of life 5 What TIME of day is your pain at its worst? morning and evening Sleep (in general) NA  Pain is worse with: walking, bending, standing and some activites Pain improves with: rest, heat/ice, pacing activities and medication Relief from Meds: 8  Mobility walk without assistance ability to climb steps?  yes do you drive?  yes  Function retired I need assistance with the following:  bathing, meal prep, household duties and shopping  Neuro/Psych bladder control problems weakness anxiety  Prior Studies Any changes  since last visit?  no  Physicians involved in your care Any changes since last visit?  no   Family History  Problem Relation Age of Onset  . Heart disease Father   . Factor V Leiden deficiency Father   . Factor V Leiden deficiency Brother   . Factor V Leiden deficiency Daughter    Social History   Socioeconomic History  . Marital status: Married    Spouse name: None  . Number of children: None  . Years of education: None  . Highest education level: None  Social Needs  . Financial resource strain: None  . Food insecurity - worry: None  . Food insecurity - inability: None  . Transportation needs - medical: None  . Transportation needs - non-medical: None  Occupational History  . None  Tobacco Use  . Smoking status: Current Some Day Smoker  . Smokeless tobacco: Never Used  . Tobacco comment: Smokes e cigarettes  Substance and Sexual Activity  . Alcohol use: No  . Drug use: None  . Sexual activity: None  Other Topics Concern  . None  Social History Narrative  . None   Past Surgical History:  Procedure Laterality Date  . ABDOMINAL HYSTERECTOMY    . BREAST SURGERY    . CATARACT EXTRACTION    . CHOLECYSTECTOMY     Past Medical History:  Diagnosis Date  . Arthritis   . Cataract   . COPD (chronic obstructive pulmonary disease) (HCC)   . Factor 5 Leiden mutation, heterozygous (HCC)   . Hypertension   . Myocardial infarction (HCC)    BP Marland Kitchen(!)  153/75   Pulse 67   SpO2 90%   Opioid Risk Score:  0 Fall Risk Score:  `1  Depression screen PHQ 2/9  Depression screen [facility] 2/[facility] 11/01/2017 07/25/2017 06/13/2017 03/08/2017 06/11/2016 05/04/2016 01/06/2016  Decreased Interest 0 0 0 0 0 0 3  Down, Depressed, Hopeless 0 0 0 0 0 0 3  PHQ - 2 Score 0 0 0 0 0 0 6  Altered sleeping - - - - - - -  Tired, decreased energy - - - - - - -  Change in appetite - - - - - - -  Feeling bad or failure about yourself  - - - - - - -  Trouble concentrating - - - - - - -  Moving slowly or  fidgety/restless - - - - - - -  Suicidal thoughts - - - - - - -  PHQ-9 Score - - - - - - -     Review of Systems  Constitutional: Positive for appetite change and unexpected weight change.  HENT: Negative.   Eyes: Negative.   Respiratory: Positive for shortness of breath.   Gastrointestinal: Positive for abdominal pain, constipation and nausea.  Genitourinary: Negative.   Musculoskeletal: Negative.   Skin: Negative.   Allergic/Immunologic: Negative.   Neurological: Positive for weakness.  Hematological: Bruises/bleeds easily.  Psychiatric/Behavioral: The patient is nervous/anxious.   All other systems reviewed and are negative.      Objective:   Physical Exam  Constitutional: She is oriented to person, place, and time. She appears well-developed and well-nourished.  HENT:  Head: Normocephalic and atraumatic.  Neck: Normal range of motion. Neck supple.  Cardiovascular: Normal rate and regular rhythm.  Pulmonary/Chest: Effort normal and breath sounds normal.  Continuous Oxygen at 3 liters nasal cannula  Musculoskeletal:  Normal Muscle Bulk and Muscle Testing Reveals:  Upper Extremities: Full  ROM 90  and Muscle Strength  5/5 Back without spinal tenderness noted Lower Extremities: Full ROM and Muscle Strength 5/5 Arises from Table with Ease  Narrow Based Gait    Neurological: She is alert and oriented to person, place, and time.  Skin: Skin is warm and dry.  Psychiatric: She has a normal mood and affect.  Nursing note and vitals reviewed.         Assessment & Plan:  1. History of lumbar spinal stenosis: 11/01/2017 Refilled: Hydrocodone 5/325mg  one tablet every 8 hours, may take an extra tablet when pain is severe.#85  2. Recent left occipital infarct, history of factor V 5 Leiden deficiency: On coumadin. Continue to monitor.11/01/2017 3. Fibromyalgia syndrome: Continue with exercise regime. 11/01/2017 4. Polymyalgia rheumatica: Rheumatology  Following.11/01/2017 5. Chronic pain syndrome: Continue with exercise, heat, and ice therapy. 11/01/2017 6. Nicotine Dependence: Encouraged Smoking Cessation.11/01/2017 7. Hereditary and Idiopathic Peripheral Neuropathy: Continue to Monitor, Effexor discontinued due to elevated Liver Profile per PCP.11/01/2017 8. Right Shoulder Pain/ Subacromial Tendonitis of Right Shoulder. No complaints today. Continue HEP as tolerated and current medication regime. 11/01/2017 9. Right Greater Trochanteric Bursitis: S/P Cortisone Injection on 08/23/2017 with  Dr. Wynn BankerKirsteins: good relief noted: .Continue with Ice/Heat Therapy. Continue to Monitor. 11/01/2017.  20 minutes of face to face patient care time was spent during this visit. All questions were encouraged and answered.   F/U in 1 month

## 2017-12-06 ENCOUNTER — Encounter: Payer: Medicare Other | Admitting: Registered Nurse

## 2017-12-09 ENCOUNTER — Encounter: Payer: Medicare Other | Attending: Physical Medicine & Rehabilitation | Admitting: Registered Nurse

## 2017-12-09 ENCOUNTER — Encounter: Payer: Self-pay | Admitting: Registered Nurse

## 2017-12-09 VITALS — BP 137/71 | HR 80

## 2017-12-09 DIAGNOSIS — Z5181 Encounter for therapeutic drug level monitoring: Secondary | ICD-10-CM | POA: Insufficient documentation

## 2017-12-09 DIAGNOSIS — Z79899 Other long term (current) drug therapy: Secondary | ICD-10-CM | POA: Insufficient documentation

## 2017-12-09 DIAGNOSIS — G894 Chronic pain syndrome: Secondary | ICD-10-CM | POA: Insufficient documentation

## 2017-12-09 DIAGNOSIS — M48061 Spinal stenosis, lumbar region without neurogenic claudication: Secondary | ICD-10-CM | POA: Diagnosis not present

## 2017-12-09 DIAGNOSIS — M353 Polymyalgia rheumatica: Secondary | ICD-10-CM | POA: Diagnosis not present

## 2017-12-09 DIAGNOSIS — M5416 Radiculopathy, lumbar region: Secondary | ICD-10-CM

## 2017-12-09 MED ORDER — HYDROCODONE-ACETAMINOPHEN 5-325 MG PO TABS: 1.0000 | ORAL_TABLET | Freq: Three times a day (TID) | ORAL | 0 refills | Status: DC | PRN

## 2017-12-09 NOTE — Progress Notes (Signed)
Subjective:    Patient ID: [name], female    DOB: 1946-09-25, 72 y.o.   MRN: [id number]  HPI: [name] is a 72 year old female who returns for follow up appointmentfor chronic pain and medication refill.She states her pain is located in her lower back radiating into her right lower extremity. She rates her pain 5. Her current exercise regime is walking.    Ms. Kimbler Morphine equivalent is 7.82  MME.  She is also prescribed Alprazolam by [name]/  Dr. Virginia Rochester .We have reviewed the black box warning of using opioids and benzodiazepines. I highlighted the dangers of using these drugs together and discussed the adverse events including respiratory suppression, overdose, cognitive impairment and importance of  compliance with current regimen. She verbalizes understanding, we will continue to monitor and adjust as indicated.     Ms. Gaetz was diagnosed with Pelvic Floor Dyssynergia: GI Dr. Fraser Din following, she will be attending physical therapy for pelvic floor therapy reports her daughter.   Last UDS was performed on 03/08/2017, it was consistent.   Pain Inventory Average Pain 6 Pain Right Now 5 My pain is dull, tingling and aching  In the last 24 hours, has pain interfered with the following? General activity 6 Relation with others 4 Enjoyment of life 6 What TIME of day is your pain at its worst? morning and evening Sleep (in general) NA  Pain is worse with: walking, bending, standing and some activites Pain improves with: rest, heat/ice, pacing activities and medication Relief from Meds: 8  Mobility walk without assistance ability to climb steps?  yes do you drive?  yes  Function retired I need assistance with the following:  bathing, meal prep, household duties and shopping  Neuro/Psych bladder control problems weakness anxiety  Prior Studies Any changes since last visit?  no  Physicians involved in your care Any changes since last visit?   no   Family History  Problem Relation Age of Onset  . Heart disease Father   . Factor V Leiden deficiency Father   . Factor V Leiden deficiency Brother   . Factor V Leiden deficiency Daughter    Social History   Socioeconomic History  . Marital status: Married    Spouse name: Not on file  . Number of children: Not on file  . Years of education: Not on file  . Highest education level: Not on file  Social Needs  . Financial resource strain: Not on file  . Food insecurity - worry: Not on file  . Food insecurity - inability: Not on file  . Transportation needs - medical: Not on file  . Transportation needs - non-medical: Not on file  Occupational History  . Not on file  Tobacco Use  . Smoking status: Current Some Day Smoker  . Smokeless tobacco: Never Used  . Tobacco comment: Smokes e cigarettes  Substance and Sexual Activity  . Alcohol use: No  . Drug use: Not on file  . Sexual activity: Not on file  Other Topics Concern  . Not on file  Social History Narrative  . Not on file   Past Surgical History:  Procedure Laterality Date  . ABDOMINAL HYSTERECTOMY    . BREAST SURGERY    . CATARACT EXTRACTION    . CHOLECYSTECTOMY     Past Medical History:  Diagnosis Date  . Arthritis   . Cataract   . COPD (chronic obstructive pulmonary disease) (HCC)   . Factor 5 Leiden mutation, heterozygous (  HCC)   . Hypertension   . Myocardial infarction (HCC)    There were no vitals taken for this visit.  Opioid Risk Score:  0 Fall Risk Score:  `1  Depression screen PHQ 2/9  [facility] 11/01/2017 07/25/2017 06/13/2017 03/08/2017 06/11/2016 05/04/2016 01/06/2016  Decreased Interest 0 0 0 0 0 0 3  Down, Depressed, Hopeless 0 0 0 0 0 0 3  PHQ - 2 Score 0 0 0 0 0 0 6  Altered sleeping - - - - - - -  Tired, decreased energy - - - - - - -  Change in appetite - - - - - - -  Feeling bad or failure about yourself  - - - - - - -  Trouble concentrating - - - - - - -  Moving  slowly or fidgety/restless - - - - - - -  Suicidal thoughts - - - - - - -  PHQ-9 Score - - - - - - -     Review of Systems  Constitutional: Positive for appetite change and unexpected weight change.  HENT: Negative.   Eyes: Negative.   Respiratory: Positive for shortness of breath.   Gastrointestinal: Positive for abdominal pain, constipation and nausea.  Genitourinary: Negative.   Musculoskeletal: Negative.   Skin: Negative.   Allergic/Immunologic: Negative.   Neurological: Positive for weakness.  Hematological: Bruises/bleeds easily.  Psychiatric/Behavioral: The patient is nervous/anxious.   All other systems reviewed and are negative.      Objective:   Physical Exam  Constitutional: She is oriented to person, place, and time. She appears well-developed and well-nourished.  HENT:  Head: Normocephalic and atraumatic.  Neck: Normal range of motion. Neck supple.  Cervical Paraspinal Tenderness: C-5-C-6  Pulmonary/Chest: Effort normal and breath sounds normal.  Continuous Oxygen at 3 liters nasal cannula   Musculoskeletal:  Normal Muscle Bulk and Muscle Testing Reveals:  Upper Extremities: Full ROM and  Muscle Strength  5/5 Bilateral AC Joint Tenderness Lumbar Paraspinal Tenderness: L-3-L-5 Lower Extremities: Full ROM and Muscle Strength 5/5 Arises from Table with Ease  Narrow Based Gait    Neurological: She is alert and oriented to person, place, and time.  Skin: Skin is warm and dry.  Psychiatric: She has a normal mood and affect.  Nursing note and vitals reviewed.         Assessment & Plan:  1. History of lumbar spinal stenosis/ Right Lumbar Radiculitis. Continue to Monitor: 12/09/2017 Refilled: Hydrocodone 5/325mg  one tablet every 8 hours, may take an extra tablet when pain is severe.#85  2. Recent left occipital infarct, history of factor V 5 Leiden deficiency: On coumadin. Continue to monitor.12/09/2017 3. Fibromyalgia syndrome: Continue with exercise  regime. 12/09/2017 4. Polymyalgia rheumatica: Rheumatology Following.12/09/2017 5. Chronic pain syndrome: Continue with exercise, heat, and ice therapy. 12/09/2017 6. Nicotine Dependence: Encouraged Smoking Cessation.12/09/2017 7. Hereditary and Idiopathic Peripheral Neuropathy: Continue to Monitor, Effexor discontinued due to elevated Liver Profile per PCP.12/09/2017 8. Right Shoulder Pain/ Subacromial Tendonitis of Right Shoulder. No complaints today. Continue HEP as tolerated and current medication regime. 12/09/2017 9. Right Greater Trochanteric Bursitis: No complaints today. S/P Cortisone Injection on 08/23/2017 with  Dr. Wynn BankerKirsteins: good relief noted: .Continue with Ice/Heat Therapy. Continue to Monitor. 12/09/2017  20 minutes of face to face patient care time was spent during this visit. All questions were encouraged and answered.   F/U in 1 month

## 2018-01-10 ENCOUNTER — Encounter: Payer: Medicare Other | Attending: Physical Medicine & Rehabilitation | Admitting: Registered Nurse

## 2018-01-10 ENCOUNTER — Encounter: Payer: Self-pay | Admitting: Registered Nurse

## 2018-01-10 VITALS — BP 111/54 | HR 68

## 2018-01-10 DIAGNOSIS — M353 Polymyalgia rheumatica: Secondary | ICD-10-CM | POA: Diagnosis not present

## 2018-01-10 DIAGNOSIS — M48061 Spinal stenosis, lumbar region without neurogenic claudication: Secondary | ICD-10-CM

## 2018-01-10 DIAGNOSIS — G609 Hereditary and idiopathic neuropathy, unspecified: Secondary | ICD-10-CM

## 2018-01-10 DIAGNOSIS — Z79899 Other long term (current) drug therapy: Secondary | ICD-10-CM

## 2018-01-10 DIAGNOSIS — G894 Chronic pain syndrome: Secondary | ICD-10-CM | POA: Diagnosis not present

## 2018-01-10 DIAGNOSIS — Z5181 Encounter for therapeutic drug level monitoring: Secondary | ICD-10-CM | POA: Diagnosis not present

## 2018-01-10 DIAGNOSIS — M5416 Radiculopathy, lumbar region: Secondary | ICD-10-CM | POA: Diagnosis not present

## 2018-01-10 MED ORDER — HYDROCODONE-ACETAMINOPHEN 5-325 MG PO TABS: 1.0000 | ORAL_TABLET | Freq: Three times a day (TID) | ORAL | 0 refills | Status: DC | PRN

## 2018-01-10 NOTE — Progress Notes (Signed)
Subjective:    Patient ID: [name], female    DOB: 02/25/1946, 72 y.o.   MRN: [id number]  HPI: [name] is a 72 year old female who returns for follow up appointmentfor chronic pain and medication refill. She states her pain is located in her lower back radiating into her right lower extremity. She rates her pain 4. Her current exercise regime is walking.   [name] Morphine equivalent is 13.68  MME. She is also prescribed Alprazolam by Darl PikesSusan Payne/ Dr. Virginia Rochesterrr .We have reviewed the black box warning of using opioids and benzodiazepines. I highlighted the dangers of using these drugs together and discussed the adverse events including respiratory suppression, overdose, cognitive impairment and importance of  compliance with current regimen. She verbalizes understanding, we will continue to monitor and adjust as indicated.     [name] was diagnosed with Pelvic Floor Dyssynergia: GI Dr. Fraser DinNixon following, she will be attending physical therapy for pelvic floor therapy reports her daughter.   Daughter in Room  Last UDS was performed on 03/08/2017, it was consistent. Oral Swab Performed Today.   Pain Inventory Average Pain 6 Pain Right Now 4 My pain is constant, dull and aching  In the last 24 hours, has pain interfered with the following? General activity 6 Relation with others 6 Enjoyment of life 6 What TIME of day is your pain at its worst? morning Sleep (in general) Good  Pain is worse with: walking, bending and standing Pain improves with: heat/ice, pacing activities and medication Relief from Meds: 6  Mobility walk without assistance ability to climb steps?  yes do you drive?  yes transfers alone  Function retired I need assistance with the following:  bathing, meal prep, household duties and shopping Do you have any goals in this area?  yes  Neuro/Psych bladder control problems bowel control  problems weakness numbness tingling anxiety  Prior Studies Any changes since last visit?  no  Physicians involved in your care Any changes since last visit?  no Primary care Dr. Virginia Rochesterrr   Family History  Problem Relation Age of Onset  . Heart disease Father   . Factor V Leiden deficiency Father   . Factor V Leiden deficiency Brother   . Factor V Leiden deficiency Daughter    Social History   Socioeconomic History  . Marital status: Married    Spouse name: Not on file  . Number of children: Not on file  . Years of education: Not on file  . Highest education level: Not on file  Social Needs  . Financial resource strain: Not on file  . Food insecurity - worry: Not on file  . Food insecurity - inability: Not on file  . Transportation needs - medical: Not on file  . Transportation needs - non-medical: Not on file  Occupational History  . Not on file  Tobacco Use  . Smoking status: Current Some Day Smoker  . Smokeless tobacco: Never Used  . Tobacco comment: Smokes e cigarettes  Substance and Sexual Activity  . Alcohol use: No  . Drug use: Not on file  . Sexual activity: Not on file  Other Topics Concern  . Not on file  Social History Narrative  . Not on file   Past Surgical History:  Procedure Laterality Date  . ABDOMINAL HYSTERECTOMY    . BREAST SURGERY    . CATARACT EXTRACTION    . CHOLECYSTECTOMY     Past Medical History:  Diagnosis Date  . Arthritis   .  Cataract   . COPD (chronic obstructive pulmonary disease) (HCC)   . Factor 5 Leiden mutation, heterozygous (HCC)   . Hypertension   . Myocardial infarction (HCC)    There were no vitals taken for this visit.  Opioid Risk Score:  0 Fall Risk Score:  `1  Depression screen PHQ 2/9  Depression screen [facility] 2/9 11/01/2017 07/25/2017 06/13/2017 03/08/2017 06/11/2016 05/04/2016 01/06/2016  Decreased Interest 0 0 0 0 0 0 3  Down, Depressed, Hopeless 0 0 0 0 0 0 3  PHQ - 2 Score 0 0 0 0 0 0 6  Altered sleeping - -  - - - - -  Tired, decreased energy - - - - - - -  Change in appetite - - - - - - -  Feeling bad or failure about yourself  - - - - - - -  Trouble concentrating - - - - - - -  Moving slowly or fidgety/restless - - - - - - -  Suicidal thoughts - - - - - - -  PHQ-9 Score - - - - - - -     Review of Systems  Constitutional: Positive for appetite change and unexpected weight change.  HENT: Negative.   Eyes: Negative.   Respiratory: Positive for shortness of breath.   Gastrointestinal: Positive for abdominal pain, constipation and nausea.       Bowel control problem   Genitourinary: Negative.        Bladder control problem   Musculoskeletal: Negative.   Skin: Negative.   Allergic/Immunologic: Negative.   Neurological: Positive for weakness and numbness.       Tingling  Hematological: Bruises/bleeds easily.  Psychiatric/Behavioral: The patient is nervous/anxious.   All other systems reviewed and are negative.      Objective:   Physical Exam  Constitutional: She is oriented to person, place, and time. She appears well-developed and well-nourished.  HENT:  Head: Normocephalic and atraumatic.  Neck: Normal range of motion. Neck supple.  Cervical Paraspinal Tenderness: C-5-C-6  Cardiovascular: Normal rate and regular rhythm.  Pulmonary/Chest: Effort normal and breath sounds normal.  Continuous Oxygen at 3 liters nasal cannula   Musculoskeletal:  Normal Muscle Bulk and Muscle Testing Reveals:  Upper Extremities: Full  ROM and  Muscle Strength 5/5 Back without spinal tenderness noted Lower Extremities: Full ROM and Muscle Strength 5/5 Arises from Table with Ease  Narrow Based Gait    Neurological: She is alert and oriented to person, place, and time.  Skin: Skin is warm and dry.  Psychiatric: She has a normal mood and affect.  Nursing note and vitals reviewed.         Assessment & Plan:  1. History of lumbar spinal stenosis/ Right Lumbar Radiculitis. Continue to  Monitor: 01/10/2018 Refilled: Hydrocodone 5/325mg  one tablet every 8 hours, may take an extra tablet when pain is severe.#85  2. Recent left occipital infarct, history of factor V 5 Leiden deficiency: On coumadin. Continue to monitor.01/10/2018 3. Fibromyalgia syndrome: Continue with exercise regime. 01/10/2018 4. Polymyalgia rheumatica: Rheumatology Following.01/10/2018 5. Chronic pain syndrome: Continue with exercise, heat, and ice therapy. 01/10/2018 6. Nicotine Dependence: Encouraged Smoking Cessation.01/10/2018 7. Hereditary and Idiopathic Peripheral Neuropathy: Continue to Monitor, Effexor discontinued due to elevated Liver Profile per PCP.01/10/2018 8. Right Shoulder Pain/ Subacromial Tendonitis of Right Shoulder. No complaints today. Continue HEP as tolerated and current medication regime. 01/10/2018 9. Right Greater Trochanteric Bursitis: No complaints today. S/P Cortisone Injection on 08/23/2017 with  Dr. Wynn Banker: good relief  noted: Marland KitchenContinue with Ice/Heat Therapy. Continue to Monitor. 01/10/2018  20 minutes of face to face patient care time was spent during this visit. All questions were encouraged and answered.   F/U in 1 month

## 2018-01-14 LAB — DRUG TOX MONITOR 1 W/CONF, ORAL FLD
Amphetamines: NEGATIVE ng/mL (ref <10)
BENZODIAZEPINES: NEGATIVE ng/mL (ref <0.50)
BUPRENORPHINE: NEGATIVE ng/mL (ref <0.10)
Barbiturates: NEGATIVE ng/mL (ref <10)
CODEINE: NEGATIVE ng/mL (ref <2.5)
Cocaine: NEGATIVE ng/mL (ref <5.0)
Cotinine: 13.8 ng/mL — ABNORMAL HIGH (ref <5.0)
DIHYDROCODEINE: 3.4 ng/mL — AB (ref <2.5)
Fentanyl: NEGATIVE ng/mL (ref <0.10)
HYDROMORPHONE: NEGATIVE ng/mL (ref <2.5)
Heroin Metabolite: NEGATIVE ng/mL (ref <1.0)
Hydrocodone: 30.2 ng/mL — ABNORMAL HIGH (ref <2.5)
MARIJUANA: NEGATIVE ng/mL (ref <2.5)
MDMA: NEGATIVE ng/mL (ref <10)
MORPHINE: NEGATIVE ng/mL (ref <2.5)
Meprobamate: NEGATIVE ng/mL (ref <2.5)
Methadone: NEGATIVE ng/mL (ref <5.0)
Nicotine Metabolite: POSITIVE ng/mL — AB (ref <5.0)
Norhydrocodone: NEGATIVE ng/mL (ref <2.5)
Noroxycodone: NEGATIVE ng/mL (ref <2.5)
Opiates: POSITIVE ng/mL — AB (ref <2.5)
Oxycodone: NEGATIVE ng/mL (ref <2.5)
Oxymorphone: NEGATIVE ng/mL (ref <2.5)
Phencyclidine: NEGATIVE ng/mL (ref <10)
TAPENTADOL: NEGATIVE ng/mL (ref <5.0)
TRAMADOL: NEGATIVE ng/mL (ref <5.0)
Zolpidem: NEGATIVE ng/mL (ref <5.0)

## 2018-01-14 LAB — DRUG TOX ALC METAB W/CON, ORAL FLD: ALCOHOL METABOLITE: NEGATIVE ng/mL (ref <25)

## 2018-02-14 ENCOUNTER — Encounter: Payer: Medicare Other | Admitting: Registered Nurse

## 2018-02-18 ENCOUNTER — Encounter: Payer: Medicare Other | Admitting: Registered Nurse

## 2018-02-25 ENCOUNTER — Encounter: Payer: Medicare Other | Attending: Physical Medicine & Rehabilitation | Admitting: Registered Nurse

## 2018-02-25 ENCOUNTER — Encounter: Payer: Self-pay | Admitting: Registered Nurse

## 2018-02-25 VITALS — BP 103/61 | HR 77 | Resp 14 | Ht 64.0 in | Wt 131.0 lb

## 2018-02-25 DIAGNOSIS — M353 Polymyalgia rheumatica: Secondary | ICD-10-CM | POA: Diagnosis not present

## 2018-02-25 DIAGNOSIS — M5416 Radiculopathy, lumbar region: Secondary | ICD-10-CM | POA: Diagnosis not present

## 2018-02-25 DIAGNOSIS — M48061 Spinal stenosis, lumbar region without neurogenic claudication: Secondary | ICD-10-CM

## 2018-02-25 DIAGNOSIS — Z5181 Encounter for therapeutic drug level monitoring: Secondary | ICD-10-CM | POA: Diagnosis not present

## 2018-02-25 DIAGNOSIS — G894 Chronic pain syndrome: Secondary | ICD-10-CM | POA: Insufficient documentation

## 2018-02-25 DIAGNOSIS — Z79899 Other long term (current) drug therapy: Secondary | ICD-10-CM | POA: Insufficient documentation

## 2018-02-25 MED ORDER — HYDROCODONE-ACETAMINOPHEN 5-325 MG PO TABS: 1.0000 | ORAL_TABLET | Freq: Three times a day (TID) | ORAL | 0 refills | Status: DC | PRN

## 2018-02-25 NOTE — Progress Notes (Signed)
Subjective:    Patient ID: [name], female    DOB: 05-03-1946, 72 y.o.   MRN: [id number]  HPI: [name] is a 72 year old female who returns for follow up appointment for chronic pain and medication refill. She states her pain this morning was in her lower back at this time she is pain free. Also reports abdominal cramping, Dr. Gwenevere Abbot Gastroenterologist following. She rates her pain 4. Her current exercise regime is walking.   [name] is 13.68 MME.  Sheis also prescribed Alprazolam by Dr. Virginia Rochester.We have discussed the black box warning of using opioids and benzodiazepines. I highlighted the dangers of using these drugs together and discussed the adverse events including respiratory suppression, overdose, cognitive impairment and importance of  compliance with current regimen. She verbalizes understanding, we will continue to monitor and adjust as indicated.    Last UDS was performed on 03/08/2017, it was consistent.   [name] in room.   Pain Inventory Average Pain 6 Pain Right Now 4 My pain is dull, tingling and aching  In the last 24 hours, has pain interfered with the following? General activity 5 Relation with others 3 Enjoyment of life 4 What TIME of day is your pain at its worst? morning, daytime, evening Sleep (in general) Good  Pain is worse with: walking, bending, sitting, standing and some activites Pain improves with: rest, heat/ice, pacing activities and medication Relief from Meds: no selection  Mobility walk without assistance ability to climb steps?  yes do you drive?  yes transfers alone Do you have any goals in this area?  yes  Function retired I need assistance with the following:  meal prep and shopping  Neuro/Psych bladder control problems weakness numbness tingling trouble walking anxiety  Prior Studies Any changes since last visit?  no  Physicians involved in your care Any changes since last visit?   no   Family History  Problem Relation Age of Onset  . Heart disease Father   . Factor V Leiden deficiency Father   . Factor V Leiden deficiency Brother   . Factor V Leiden deficiency [name]    Social History   Socioeconomic History  . Marital status: Married    Spouse name: Not on file  . Number of children: Not on file  . Years of education: Not on file  . Highest education level: Not on file  Occupational History  . Not on file  Social Needs  . Financial resource strain: Not on file  . Food insecurity:    Worry: Not on file    Inability: Not on file  . Transportation needs:    Medical: Not on file    Non-medical: Not on file  Tobacco Use  . Smoking status: Current Some Day Smoker  . Smokeless tobacco: Never Used  . Tobacco comment: Smokes e cigarettes  Substance and Sexual Activity  . Alcohol use: No  . Drug use: Not on file  . Sexual activity: Not on file  Lifestyle  . Physical activity:    Days per week: Not on file    Minutes per session: Not on file  . Stress: Not on file  Relationships  . Social connections:    Talks on phone: Not on file    Gets together: Not on file    Attends religious service: Not on file    Active member of club or organization: Not on file    Attends meetings of clubs or organizations: Not on  file    Relationship status: Not on file  Other Topics Concern  . Not on file  Social History Narrative  . Not on file   Past Surgical History:  Procedure Laterality Date  . ABDOMINAL HYSTERECTOMY    . BREAST SURGERY    . CATARACT EXTRACTION    . CHOLECYSTECTOMY     Past Medical History:  Diagnosis Date  . Arthritis   . Cataract   . COPD (chronic obstructive pulmonary disease) (HCC)   . Factor 5 Leiden mutation, heterozygous (HCC)   . Hypertension   . Myocardial infarction (HCC)    BP 103/61 (BP Location: Left Arm, Patient Position: Sitting, Cuff Size: Normal)   Pulse 77   Resp 14   Ht 5\' 4"  (1.626 m)   Wt 131 lb (59.4 kg)    SpO2 90%   BMI 22.49 kg/m   Opioid Risk Score:   Fall Risk Score:  `1  Depression screen PHQ 2/9  Depression screen [facility] 2/9 11/01/2017 07/25/2017 06/13/2017 03/08/2017 06/11/2016 05/04/2016 01/06/2016  Decreased Interest 0 0 0 0 0 0 3  Down, Depressed, Hopeless 0 0 0 0 0 0 3  PHQ - 2 Score 0 0 0 0 0 0 6  Altered sleeping - - - - - - -  Tired, decreased energy - - - - - - -  Change in appetite - - - - - - -  Feeling bad or failure about yourself  - - - - - - -  Trouble concentrating - - - - - - -  Moving slowly or fidgety/restless - - - - - - -  Suicidal thoughts - - - - - - -  PHQ-9 Score - - - - - - -    Review of Systems  Constitutional: Positive for appetite change and unexpected weight change.  HENT: Negative.   Eyes: Negative.   Respiratory: Positive for shortness of breath.   Gastrointestinal: Positive for abdominal pain, constipation and nausea.  Endocrine: Negative.   Musculoskeletal: Positive for back pain and gait problem.  Allergic/Immunologic: Negative.   Neurological: Positive for weakness and numbness.       Tingling   Hematological: Bruises/bleeds easily.  Psychiatric/Behavioral: The patient is nervous/anxious.        Objective:   Physical Exam  Constitutional: She is oriented to person, place, and time. She appears well-developed and well-nourished.  HENT:  Head: Normocephalic and atraumatic.  Neck: Normal range of motion. Neck supple.  Cardiovascular: Normal rate and regular rhythm.  Pulmonary/Chest: Effort normal and breath sounds normal.  Musculoskeletal:  Normal Muscle Bulk and Muscle Testing Reveals: Upper Extremities: Right: Decreased ROM 90 Degrees and Muscle Strength 5/5 Left: Full ROM and Muscle Strength 5/5 Back without spinal tenderness noted Lower Extremities: Full ROM and Muscle Strength 5/5 Arises from Table with ease Narrow Based Gait  Neurological: She is alert and oriented to person, place, and time.  Skin: Skin is warm and dry.   Psychiatric: She has a normal mood and affect.  Nursing note and vitals reviewed.         Assessment & Plan:  1. History of lumbar spinal stenosis/ Right Lumbar Radiculitis. Continue current Medication regime and Encouraged to increase HEP as Tolerated.  Continue to Monitor: 02/25/2018 Refilled:Hydrocodone 5/325mg  one tablet every 8 hours, may take an extra tablet when pain is severe.#85  2. Recent left occipital infarct, history of factor V 5 Leiden deficiency: On coumadin. Continue to monitor.02/25/2018 3. Fibromyalgia syndrome: Encouraged to  increase HEP as tolerated. 02/25/2018. 4. Polymyalgia rheumatica: Rheumatology Following.02/25/2018 5. Chronic pain syndrome: Continue current medication Regimen .Continue with exercise, heat, and ice therapy. 02/25/2018 6. Nicotine Dependence: Encouraged Smoking Cessation.02/25/2018 7. Hereditary and Idiopathic Peripheral Neuropathy: Continue to Monitor, Effexor discontinued due to elevated Liver Profile per PCP.02/25/2018  Continue HEP as tolerated and current medication regime. 02/25/2018  20 minutes of face to face patient care time was spent during this visit. All questions were encouraged and answered.   F/U in 1 month

## 2018-03-14 ENCOUNTER — Ambulatory Visit: Payer: Medicare Other | Admitting: Registered Nurse

## 2018-03-28 ENCOUNTER — Encounter: Payer: Medicare Other | Admitting: Registered Nurse

## 2018-04-04 ENCOUNTER — Encounter: Payer: Medicare Other | Attending: Physical Medicine & Rehabilitation | Admitting: Registered Nurse

## 2018-04-04 ENCOUNTER — Telehealth: Payer: Self-pay | Admitting: Registered Nurse

## 2018-04-04 ENCOUNTER — Encounter: Payer: Self-pay | Admitting: Registered Nurse

## 2018-04-04 ENCOUNTER — Other Ambulatory Visit: Payer: Self-pay

## 2018-04-04 VITALS — BP 80/50 | HR 67 | Ht 64.0 in | Wt 131.0 lb

## 2018-04-04 DIAGNOSIS — G894 Chronic pain syndrome: Secondary | ICD-10-CM | POA: Diagnosis not present

## 2018-04-04 DIAGNOSIS — Z5181 Encounter for therapeutic drug level monitoring: Secondary | ICD-10-CM

## 2018-04-04 DIAGNOSIS — M353 Polymyalgia rheumatica: Secondary | ICD-10-CM | POA: Diagnosis not present

## 2018-04-04 DIAGNOSIS — Z79899 Other long term (current) drug therapy: Secondary | ICD-10-CM

## 2018-04-04 DIAGNOSIS — M48061 Spinal stenosis, lumbar region without neurogenic claudication: Secondary | ICD-10-CM

## 2018-04-04 DIAGNOSIS — F3289 Other specified depressive episodes: Secondary | ICD-10-CM

## 2018-04-04 MED ORDER — HYDROCODONE-ACETAMINOPHEN 5-325 MG PO TABS: 1.0000 | ORAL_TABLET | Freq: Three times a day (TID) | ORAL | 0 refills | Status: DC | PRN

## 2018-04-04 NOTE — Progress Notes (Signed)
Subjective:    Patient ID: [name], female    DOB: 02/28/1946, 72 y.o.   MRN: [id number]  HPI: [name] is a 72 year old female who returns for follow up appointment for chronic pain and medication refill. [name] states her pain is usually in her lower back at this time [name] is pain free. [name] rates her pain 4. Her current exercise regime is walking.   [name] admits to poor appetite due to abdominal cramping. [name] has a new gastroenterologist and he has prescribed a low dose elavil. Spent a great deal of time educating [name] on her  dietary regimen, [name] verbalizes understanding. [name] states "[name]'s i Tired", admits to being Depressed, no suicidal ideation [name] is  tearful. Emotional support given.   Daughter in room, all questions answered.   Pain Inventory Average Pain 6 Pain Right Now 4 My pain is dull, tingling and aching  In the last 24 hours, has pain interfered with the following? General activity 5 Relation with others 3 Enjoyment of life 5 What TIME of day is your pain at its worst? morning Sleep (in general) NA  Pain is worse with: walking, bending and standing Pain improves with: rest, heat/ice, pacing activities and medication Relief from Meds: 6  Mobility walk without assistance ability to climb steps?  yes do you drive?  yes transfers alone  Function retired I need assistance with the following:  feeding, bathing, meal prep and household duties  Neuro/Psych weakness numbness tingling trouble walking spasms depression  Prior Studies Any changes since last visit?  no  Physicians involved in your care Any changes since last visit?  no   Family History  Problem Relation Age of Onset  . Heart disease Father   . Factor V Leiden deficiency Father   . Factor V Leiden deficiency Brother   . Factor V Leiden deficiency Daughter    Social History   Socioeconomic History  . Marital status: Married    Spouse name: Not on file  .  Number of children: Not on file  . Years of education: Not on file  . Highest education level: Not on file  Occupational History  . Not on file  Social Needs  . Financial resource strain: Not on file  . Food insecurity:    Worry: Not on file    Inability: Not on file  . Transportation needs:    Medical: Not on file    Non-medical: Not on file  Tobacco Use  . Smoking status: Current Some Day Smoker  . Smokeless tobacco: Never Used  . Tobacco comment: Smokes e cigarettes  Substance and Sexual Activity  . Alcohol use: No  . Drug use: Not on file  . Sexual activity: Not on file  Lifestyle  . Physical activity:    Days per week: Not on file    Minutes per session: Not on file  . Stress: Not on file  Relationships  . Social connections:    Talks on phone: Not on file    Gets together: Not on file    Attends religious service: Not on file    Active member of club or organization: Not on file    Attends meetings of clubs or organizations: Not on file    Relationship status: Not on file  Other Topics Concern  . Not on file  Social History Narrative  . Not on file   Past Surgical History:  Procedure Laterality Date  . ABDOMINAL HYSTERECTOMY    .  BREAST SURGERY    . CATARACT EXTRACTION    . CHOLECYSTECTOMY     Past Medical History:  Diagnosis Date  . Arthritis   . Cataract   . COPD (chronic obstructive pulmonary disease) (HCC)   . Factor 5 Leiden mutation, heterozygous (HCC)   . Hypertension   . Myocardial infarction (HCC)    BP (!) 89/54   Pulse 68   Ht  (1.626 m)   Wt 131 lb (59.4 kg)   SpO2 90%   BMI 22.49 kg/m   Opioid Risk Score:   Fall Risk Score:  `1  Depression screen PHQ 2/9  Depression screen [facility] 2/9 04/04/2018 11/01/2017 07/25/2017 06/13/2017 03/08/2017 06/11/2016 05/04/2016  Decreased Interest 0 0 0 0 0 0 0  Down, Depressed, Hopeless 0 0 0 0 0 0 0  PHQ - 2 Score 0 0 0 0 0 0 0  Altered sleeping - - - - - - -  Tired, decreased energy - - - - - - -   Change in appetite - - - - - - -  Feeling bad or failure about yourself  - - - - - - -  Trouble concentrating - - - - - - -  Moving slowly or fidgety/restless - - - - - - -  Suicidal thoughts - - - - - - -  PHQ-9 Score - - - - - - -    Review of Systems  Constitutional: Positive for unexpected weight change.  Eyes: Negative.   Respiratory: Negative.   Cardiovascular: Negative.   Gastrointestinal: Positive for abdominal pain, constipation and nausea.  Endocrine: Negative.   Genitourinary: Negative.   Musculoskeletal: Negative.   Skin: Negative.   Allergic/Immunologic: Negative.   Neurological: Negative.   Hematological: Negative.   Psychiatric/Behavioral: Negative.   All other systems reviewed and are negative.      Objective:   Physical Exam  Constitutional: [name] is oriented to person, place, and time. [name] appears well-developed and well-nourished.  HENT:  Head: Normocephalic and atraumatic.  Neck: Normal range of motion. Neck supple.  Cardiovascular: Normal rate and regular rhythm.  Pulmonary/Chest: Effort normal and breath sounds normal.  Wearing 3 liters nasal cannula  Musculoskeletal:  Normal Muscle Bulk and Muscle Testing Reveals: Upper Extremities: Right: Decreased ROM 90 degrees and muscle strength 5/5 and Left:Full ROM and Muscle Strength 5/5 Back without spinal tenderness noted Lower Extremities: Full ROM and Muscle Strength 5/5 Arises from Table with ease Narrow Based Gait  Neurological: [name] is alert and oriented to person, place, and time.  Skin: Skin is warm and dry.  Psychiatric: [name] has a normal mood and affect.  Nursing note and vitals reviewed.         Assessment & Plan:  1. History of lumbar spinal stenosis/ Right Lumbar Radiculitis. Continue current Medication regime and Encouraged to increase HEP as Tolerated.  Continue to Monitor: 04/04/2018 Refilled:Hydrocodone 5/325mg  one tablet every 8 hours, may take an extra tablet when pain is  severe.#85  2. Recent left occipital infarct, history of factor V 5 Leiden deficiency: On coumadin. Continue to monitor.04/04/2018 3. Fibromyalgia syndrome: Encouraged to increase HEP as tolerated. 04/04/2018. 4. Polymyalgia rheumatica: Rheumatology Following.04/04/2018 5. Chronic pain syndrome: Continue current medication Regimen .Continue with exercise, heat, and ice therapy. 04/04/2018 6. Nicotine Dependence: Encouraged Smoking Cessation.04/04/2018 7. Hereditary and Idiopathic Peripheral Neuropathy: Continue to Monitor, Effexor discontinued due to elevated Liver Profile per PCP.04/04/2018  Continue HEP as tolerated and current medication regime. 04/04/2018 8. Depression:  [name] was prescribed Elavil by Gastroenterologis. No suicidal ideation. Has Family Support. Continue to Monitor.   20 minutes of face to face patient care time was spent during this visit. All questions were encouraged and answered.   F/U in 1 month

## 2018-04-18 ENCOUNTER — Ambulatory Visit: Payer: Medicare Other | Admitting: Physical Medicine & Rehabilitation

## 2018-04-25 ENCOUNTER — Ambulatory Visit: Payer: Medicare Other | Admitting: Physical Medicine & Rehabilitation

## 2018-04-28 ENCOUNTER — Telehealth: Payer: Self-pay | Admitting: *Deleted

## 2018-04-28 MED ORDER — HYDROCODONE-ACETAMINOPHEN 5-325 MG PO TABS: 1.0000 | ORAL_TABLET | Freq: Three times a day (TID) | ORAL | 0 refills | Status: DC | PRN

## 2018-04-28 NOTE — Telephone Encounter (Signed)
[name] called to ask [name] to refill her mother's medication for vacation. She was told to call.

## 2018-04-28 NOTE — Telephone Encounter (Signed)
According to PMP Aware Web-site Hydrocodone picked up on 04/04/2018. Scheduled appointment on 05/16/2018. [name] daughter on vacation. Hydrocodone e-scribe to accommodate scheduled appointment. Placed a call to [name], she verbalizes understanding

## 2018-05-16 ENCOUNTER — Encounter: Payer: Medicare Other | Attending: Physical Medicine & Rehabilitation | Admitting: Registered Nurse

## 2018-05-16 ENCOUNTER — Encounter: Payer: Self-pay | Admitting: Registered Nurse

## 2018-05-16 VITALS — BP 127/56 | HR 74 | Resp 16 | Ht 63.0 in | Wt 127.0 lb

## 2018-05-16 DIAGNOSIS — M353 Polymyalgia rheumatica: Secondary | ICD-10-CM | POA: Diagnosis not present

## 2018-05-16 DIAGNOSIS — M5416 Radiculopathy, lumbar region: Secondary | ICD-10-CM

## 2018-05-16 DIAGNOSIS — M48061 Spinal stenosis, lumbar region without neurogenic claudication: Secondary | ICD-10-CM

## 2018-05-16 DIAGNOSIS — Z5181 Encounter for therapeutic drug level monitoring: Secondary | ICD-10-CM | POA: Insufficient documentation

## 2018-05-16 DIAGNOSIS — G894 Chronic pain syndrome: Secondary | ICD-10-CM | POA: Diagnosis not present

## 2018-05-16 DIAGNOSIS — Z79899 Other long term (current) drug therapy: Secondary | ICD-10-CM | POA: Diagnosis not present

## 2018-05-16 MED ORDER — HYDROCODONE-ACETAMINOPHEN 5-325 MG PO TABS: 1.0000 | ORAL_TABLET | Freq: Three times a day (TID) | ORAL | 0 refills | Status: DC | PRN

## 2018-05-16 NOTE — Progress Notes (Signed)
Subjective:    Patient ID: [name], female    DOB: 1946-10-13, 72 y.o.   MRN: [id number]  HPI: [name] is a 72 year old female who returns for follow up appointment for chronic pain and medication refill. She states her pain is located in her lower back radiating into her right lower extremity. She rates her pain 5. Her current exercise regime is walking and lifting light weights.   Ms. Zegarra Morphine Equivalent is 14.66 MME. She is also prescribed Alprazolam by Dr. Virginia Rochester .We have discussed the black box warning of using opioids and benzodiazepines. I highlighted the dangers of using these drugs together and discussed the adverse events including respiratory suppression, overdose, cognitive impairment and importance of compliance with current regimen. We will continue to monitor and adjust as indicated.   Last Oral Swab was Performed on 01/10/2018, it was consistent.   Pain Inventory Average Pain 5 Pain Right Now 5 My pain is sharp and tingling  In the last 24 hours, has pain interfered with the following? General activity 5 Relation with others 3 Enjoyment of life 5 What TIME of day is your pain at its worst? morning, evening Sleep (in general) Good  Pain is worse with: walking, bending, standing and some activites Pain improves with: rest, heat/ice, pacing activities and medication Relief from Meds: 8  Mobility walk without assistance ability to climb steps?  yes do you drive?  yes Do you have any goals in this area?  yes  Function retired I need assistance with the following:  bathing, meal prep, household duties and shopping  Neuro/Psych bladder control problems bowel control problems weakness numbness tingling anxiety  Prior Studies Any changes since last visit?  no  Physicians involved in your care Any changes since last visit?  no   Family History  Problem Relation Age of Onset  . Heart disease Father   . Factor V Leiden deficiency  Father   . Factor V Leiden deficiency Brother   . Factor V Leiden deficiency Daughter    Social History   Socioeconomic History  . Marital status: Married    Spouse name: Not on file  . Number of children: Not on file  . Years of education: Not on file  . Highest education level: Not on file  Occupational History  . Not on file  Social Needs  . Financial resource strain: Not on file  . Food insecurity:    Worry: Not on file    Inability: Not on file  . Transportation needs:    Medical: Not on file    Non-medical: Not on file  Tobacco Use  . Smoking status: Current Some Day Smoker  . Smokeless tobacco: Never Used  . Tobacco comment: Smokes e cigarettes  Substance and Sexual Activity  . Alcohol use: No  . Drug use: Not on file  . Sexual activity: Not on file  Lifestyle  . Physical activity:    Days per week: Not on file    Minutes per session: Not on file  . Stress: Not on file  Relationships  . Social connections:    Talks on phone: Not on file    Gets together: Not on file    Attends religious service: Not on file    Active member of club or organization: Not on file    Attends meetings of clubs or organizations: Not on file    Relationship status: Not on file  Other Topics Concern  . Not  on file  Social History Narrative  . Not on file   Past Surgical History:  Procedure Laterality Date  . ABDOMINAL HYSTERECTOMY    . BREAST SURGERY    . CATARACT EXTRACTION    . CHOLECYSTECTOMY     Past Medical History:  Diagnosis Date  . Arthritis   . Cataract   . COPD (chronic obstructive pulmonary disease) (HCC)   . Factor 5 Leiden mutation, heterozygous (HCC)   . Hypertension   . Myocardial infarction (HCC)    BP (!) 127/56 (BP Location: Left Arm, Patient Position: Sitting, Cuff Size: Normal)   Pulse 74   Resp 16   Ht 5\' 3"  (1.6 m)   Wt 127 lb (57.6 kg)   SpO2 92%   BMI 22.50 kg/m   Opioid Risk Score:   Fall Risk Score:  `1  Depression screen PHQ  2/9  Depression screen [facility] 2/9 04/04/2018 11/01/2017 07/25/2017 06/13/2017 03/08/2017 06/11/2016 05/04/2016  Decreased Interest 0 0 0 0 0 0 0  Down, Depressed, Hopeless 0 0 0 0 0 0 0  PHQ - 2 Score 0 0 0 0 0 0 0  Altered sleeping - - - - - - -  Tired, decreased energy - - - - - - -  Change in appetite - - - - - - -  Feeling bad or failure about yourself  - - - - - - -  Trouble concentrating - - - - - - -  Moving slowly or fidgety/restless - - - - - - -  Suicidal thoughts - - - - - - -  PHQ-9 Score - - - - - - -    Review of Systems  Constitutional: Positive for appetite change and unexpected weight change.  HENT: Negative.   Eyes: Negative.   Respiratory: Positive for shortness of breath.   Cardiovascular: Negative.   Gastrointestinal: Positive for constipation and nausea.  Endocrine: Negative.   Genitourinary: Positive for dysuria.  Musculoskeletal: Positive for arthralgias, back pain and myalgias.  Skin: Negative.   Allergic/Immunologic: Negative.   Neurological: Positive for weakness and numbness.       Tingling   Hematological: Bruises/bleeds easily.  Psychiatric/Behavioral: Negative for self-injury. The patient is nervous/anxious.        Objective:   Physical Exam  Constitutional: She is oriented to person, place, and time. She appears well-developed and well-nourished.  HENT:  Head: Normocephalic and atraumatic.  Neck: Normal range of motion. Neck supple.  Cardiovascular: Normal rate and regular rhythm.  Pulmonary/Chest: Effort normal and breath sounds normal.  Musculoskeletal:  Normal Muscle Bulk and Muscle Testing Reveals: Upper Extremities: Full ROM and Muscle Strength 5/5 Lumbar Paraspinal Tenderness: L-3-L-5 Lower Extremities: Full ROM and Muscle Strength 5/5 Arises from Table with ease Narrow Based Gait  Neurological: She is alert and oriented to person, place, and time.  Skin: Skin is warm and dry.  Psychiatric: She has a normal mood and affect.  Nursing  note and vitals reviewed.         Assessment & Plan:  1. History of lumbar spinal stenosis/ Right Lumbar Radiculitis.Continue current Medication regime and Encouraged to increase HEP as Tolerated.Continue to Monitor: 05/16/2018 Refilled:Hydrocodone 5/325mg  one tablet every 8 hours, may take an extra tablet when pain is severe.#85  2. Recent left occipital infarct, history of factor V 5 Leiden deficiency: On coumadin. Continue to monitor.05/16/2018 3. Fibromyalgia syndrome:Encouraged to increase HEP as tolerated. 05/16/2018. 4. Polymyalgia rheumatica: Rheumatology Following.05/16/2018 5. Chronic pain syndrome:Continue current medication  Regimen .Continue with exercise, heat, and ice therapy. 05/16/2018 6. Nicotine Dependence: Encouraged Smoking Cessation.05/16/2018 7. Hereditary and Idiopathic Peripheral Neuropathy: Continue to Monitor, Effexor discontinued due to elevated Liver Profile per PCP.05/16/2018 Continue HEP as tolerated and current medication regime. 05/16/2018 8. Depression: She was prescribed Elavil by Gastroenterologis. No suicidal ideation. Has Family Support. Continue to Monitor. 05/16/2018  20 minutes of face to face patient care time was spent during this visit. All questions were encouraged and answered.   F/U in 1 month

## 2018-06-13 ENCOUNTER — Ambulatory Visit: Payer: Medicare Other | Admitting: Physical Medicine & Rehabilitation

## 2018-06-17 ENCOUNTER — Ambulatory Visit: Payer: Medicare Other | Admitting: Physical Medicine & Rehabilitation

## 2018-06-17 ENCOUNTER — Ambulatory Visit: Payer: Medicare Other

## 2018-06-20 ENCOUNTER — Encounter: Payer: Medicare Other | Attending: Physical Medicine & Rehabilitation | Admitting: Registered Nurse

## 2018-06-20 ENCOUNTER — Other Ambulatory Visit: Payer: Self-pay

## 2018-06-20 ENCOUNTER — Encounter: Payer: Self-pay | Admitting: Registered Nurse

## 2018-06-20 VITALS — BP 101/53 | HR 74 | Wt 125.0 lb

## 2018-06-20 DIAGNOSIS — G609 Hereditary and idiopathic neuropathy, unspecified: Secondary | ICD-10-CM

## 2018-06-20 DIAGNOSIS — Z79899 Other long term (current) drug therapy: Secondary | ICD-10-CM | POA: Insufficient documentation

## 2018-06-20 DIAGNOSIS — G894 Chronic pain syndrome: Secondary | ICD-10-CM | POA: Diagnosis present

## 2018-06-20 DIAGNOSIS — R630 Anorexia: Secondary | ICD-10-CM

## 2018-06-20 DIAGNOSIS — M48061 Spinal stenosis, lumbar region without neurogenic claudication: Secondary | ICD-10-CM

## 2018-06-20 DIAGNOSIS — Z5181 Encounter for therapeutic drug level monitoring: Secondary | ICD-10-CM

## 2018-06-20 DIAGNOSIS — M5416 Radiculopathy, lumbar region: Secondary | ICD-10-CM | POA: Diagnosis not present

## 2018-06-20 DIAGNOSIS — M542 Cervicalgia: Secondary | ICD-10-CM

## 2018-06-20 DIAGNOSIS — M353 Polymyalgia rheumatica: Secondary | ICD-10-CM | POA: Diagnosis not present

## 2018-06-20 DIAGNOSIS — R54 Age-related physical debility: Secondary | ICD-10-CM

## 2018-06-20 MED ORDER — HYDROCODONE-ACETAMINOPHEN 5-325 MG PO TABS: 1.0000 | ORAL_TABLET | Freq: Three times a day (TID) | ORAL | 0 refills | Status: DC | PRN

## 2018-06-20 NOTE — Progress Notes (Signed)
Subjective:    Patient ID: [name], female    DOB: 10-Feb-1946, 72 y.o.   MRN: [id number]  HPI: [name] is a 72 year old female who returns for follow up appointment for chronic pain and medication refill. She states her pain is located in her neck, lower back and radiating into her right lower extremity. She rates her pain 6. He r current exercise regime is walking.   [name] arrive hypotensive her daughter gave her some crackers, [name] has a poor appetite and has lost weight  GI following. She is frail, she was encouraged to eat small meals, she verbalizes understanding. Daughter will be speaking to her pulmonologist for physical therapy consult she states.   [name] Morphine Equivalent is 14.66 MME.   Pain Inventory Average Pain 5 Pain Right Now 6 My pain is sharp, dull, tingling and aching  In the last 24 hours, has pain interfered with the following? General activity 6 Relation with others 3 Enjoyment of life 5 What TIME of day is your pain at its worst? n/a Sleep (in general) Good  Pain is worse with: walking, bending and standing Pain improves with: rest, heat/ice, pacing activities and medication Relief from Meds: 7  Mobility walk without assistance ability to climb steps?  yes do you drive?  yes transfers alone  Function retired  Neuro/Psych weakness numbness tingling spasms anxiety  Prior Studies Any changes since last visit?  no  Physicians involved in your care Any changes since last visit?  no   Family History  Problem Relation Age of Onset  . Heart disease Father   . Factor V Leiden deficiency Father   . Factor V Leiden deficiency Brother   . Factor V Leiden deficiency Daughter    Social History   Socioeconomic History  . Marital status: Married    Spouse name: Not on file  . Number of children: Not on file  . Years of education: Not on file  . Highest education level: Not on file  Occupational  History  . Not on file  Social Needs  . Financial resource strain: Not on file  . Food insecurity:    Worry: Not on file    Inability: Not on file  . Transportation needs:    Medical: Not on file    Non-medical: Not on file  Tobacco Use  . Smoking status: Current Some Day Smoker  . Smokeless tobacco: Never Used  . Tobacco comment: Smokes e cigarettes  Substance and Sexual Activity  . Alcohol use: No  . Drug use: Not on file  . Sexual activity: Not on file  Lifestyle  . Physical activity:    Days per week: Not on file    Minutes per session: Not on file  . Stress: Not on file  Relationships  . Social connections:    Talks on phone: Not on file    Gets together: Not on file    Attends religious service: Not on file    Active member of club or organization: Not on file    Attends meetings of clubs or organizations: Not on file    Relationship status: Not on file  Other Topics Concern  . Not on file  Social History Narrative  . Not on file   Past Surgical History:  Procedure Laterality Date  . ABDOMINAL HYSTERECTOMY    . BREAST SURGERY    . CATARACT EXTRACTION    . CHOLECYSTECTOMY     Past  Medical History:  Diagnosis Date  . Arthritis   . Cataract   . COPD (chronic obstructive pulmonary disease) (HCC)   . Factor 5 Leiden mutation, heterozygous (HCC)   . Hypertension   . Myocardial infarction (HCC)    BP 95/61   Pulse 74   Wt 125 lb (56.7 kg)   SpO2 92%   BMI 22.14 kg/m   Opioid Risk Score:   Fall Risk Score:  `1  Depression screen PHQ 2/9  [facility] 06/20/2018 04/04/2018 11/01/2017 07/25/2017 06/13/2017 03/08/2017 06/11/2016  Decreased Interest 0 0 0 0 0 0 0  Down, Depressed, Hopeless 0 0 0 0 0 0 0  PHQ - 2 Score 0 0 0 0 0 0 0  Altered sleeping - - - - - - -  Tired, decreased energy - - - - - - -  Change in appetite - - - - - - -  Feeling bad or failure about yourself  - - - - - - -  Trouble concentrating - - - - - - -  Moving slowly or  fidgety/restless - - - - - - -  Suicidal thoughts - - - - - - -  PHQ-9 Score - - - - - - -    Review of Systems  Constitutional: Negative for unexpected weight change.  HENT: Negative.   Eyes: Negative.   Respiratory: Positive for cough and shortness of breath.   Cardiovascular: Negative.   Gastrointestinal: Positive for abdominal pain, constipation and nausea.  Endocrine: Negative.   Genitourinary: Negative.   Musculoskeletal: Negative.   Skin: Negative.   Allergic/Immunologic: Negative.   Neurological: Negative.   Hematological: Bruises/bleeds easily.  Psychiatric/Behavioral: Negative.   All other systems reviewed and are negative.      Objective:   Physical Exam  Constitutional: She is oriented to person, place, and time.  Frail  HENT:  Head: Normocephalic and atraumatic.  Neck: Normal range of motion. Neck supple.  Cervical Paraspinal Tenderness: C-5-C-6  Cardiovascular: Normal rate and regular rhythm.  Pulmonary/Chest: Effort normal and breath sounds normal.  Continuous Oxygen 3 liters nasal cannula  Musculoskeletal:  Normal Muscle Bulk and Muscle Waisting Noted: Upper Extremities: Full ROM and Muscle Strength 5/5 Thoracic Paraspinal Tenderness: T-1-T-3 Lower Extremities: Full ROM and Muscle Strength 5/5 Arises from Table with ease Narrow Based gait  Neurological: She is alert and oriented to person, place, and time.  Skin: Skin is warm and dry.  Psychiatric: She has a normal mood and affect. Her behavior is normal.  Nursing note and vitals reviewed.         Assessment & Plan:  1. History of lumbar spinal stenosis/ Right Lumbar Radiculitis.Continue current Medication regime and Encouraged to increase HEP as Tolerated.Continue to Monitor: 06/20/2018 Refilled:Hydrocodone 5/325mg  one tablet every 8 hours, may take an extra tablet when pain is severe.#85  2. Recent left occipital infarct, history of factor V 5 Leiden deficiency: On coumadin. Continue to  monitor.06/20/2018 3. Fibromyalgia syndrome:Encouraged to increase HEP as tolerated. 06/20/2018. 4. Polymyalgia rheumatica: Rheumatology Following.06/20/2018 5. Chronic pain syndrome:Continue current medication Regimen .Continue with exercise, heat, and ice therapy. 06/20/2018 6. Nicotine Dependence: Encouraged Smoking Cessation.06/20/2018 7. Hereditary and Idiopathic Peripheral Neuropathy: Continue to Monitor, Effexor discontinued due to elevated Liver Profile per PCP.06/20/2018 Continue HEP as tolerated and current medication regime. 05/16/2018 8. Depression: She was prescribed Elavil by Gastroenterologis. No suicidal ideation. Has Family Support. Continue to Monitor.06/20/2018 9. Poor Appetite/ Frail: GI Following 20 minutes of face to  face patient care time was spent during this visit. All questions were encouraged and answered.   F/U in 1 month

## 2018-08-08 ENCOUNTER — Ambulatory Visit: Payer: Medicare Other | Admitting: Physical Medicine & Rehabilitation

## 2018-08-11 ENCOUNTER — Encounter: Payer: Self-pay | Admitting: Physical Medicine & Rehabilitation

## 2018-08-11 ENCOUNTER — Encounter: Payer: Medicare Other | Attending: Physical Medicine & Rehabilitation

## 2018-08-11 ENCOUNTER — Ambulatory Visit (HOSPITAL_BASED_OUTPATIENT_CLINIC_OR_DEPARTMENT_OTHER): Payer: Medicare Other | Admitting: Physical Medicine & Rehabilitation

## 2018-08-11 VITALS — BP 139/55 | HR 77 | Ht 64.5 in | Wt 123.6 lb

## 2018-08-11 DIAGNOSIS — M353 Polymyalgia rheumatica: Secondary | ICD-10-CM | POA: Diagnosis not present

## 2018-08-11 DIAGNOSIS — Z5181 Encounter for therapeutic drug level monitoring: Secondary | ICD-10-CM | POA: Diagnosis not present

## 2018-08-11 DIAGNOSIS — G894 Chronic pain syndrome: Secondary | ICD-10-CM | POA: Insufficient documentation

## 2018-08-11 DIAGNOSIS — Z79899 Other long term (current) drug therapy: Secondary | ICD-10-CM | POA: Insufficient documentation

## 2018-08-11 DIAGNOSIS — M48061 Spinal stenosis, lumbar region without neurogenic claudication: Secondary | ICD-10-CM | POA: Diagnosis not present

## 2018-08-11 MED ORDER — HYDROCODONE-ACETAMINOPHEN 5-325 MG PO TABS: 1.0000 | ORAL_TABLET | Freq: Three times a day (TID) | ORAL | 0 refills | Status: DC | PRN

## 2018-08-11 NOTE — Progress Notes (Signed)
Subjective:    Patient ID: [name], female    DOB: December 21, 1945, 72 y.o.   MRN: [id number]  HPI  72 year old female with prior history of spinal stenosis and chronic low back pain maintained on chronic narcotic analgesics.  Patient has a history of COPD and has been on chronic O2 without any worsening of her respiratory status.  Of late she has had increasing GI issues alternating diarrhea and constipation with a descending colon stricture.  She has had problems with constipation even prior to initiating narcotic analgesics.    GI treating pt with stool softener Daughter stopped carvedilol due to low BP Tried eldertonic didn't like the taste  Low back pain- doesn't do much walking,  HHPT coming out 2 x per wk Has not received home exercise program   Pain Inventory Average Pain 5 Pain Right Now 7 My pain is dull, tingling and aching  In the last 24 hours, has pain interfered with the following? General activity 6 Relation with others 5 Enjoyment of life 5 What TIME of day is your pain at its worst? daytime Sleep (in general) Good  Pain is worse with: walking, bending, sitting, inactivity, standing and some activites Pain improves with: rest and medication Relief from Meds: 7  Mobility walk without assistance ability to climb steps?  yes do you drive?  yes  Function retired I need assistance with the following:  feeding, bathing, meal prep and household duties  Neuro/Psych weakness numbness tingling trouble walking anxiety  Prior Studies Any changes since last visit?  no  Physicians involved in your care Any changes since last visit?  no   Family History  Problem Relation Age of Onset  . Heart disease Father   . Factor V Leiden deficiency Father   . Factor V Leiden deficiency Brother   . Factor V Leiden deficiency Daughter    Social History   Socioeconomic History  . Marital status: Married    Spouse name: Not on file  . Number of children: Not  on file  . Years of education: Not on file  . Highest education level: Not on file  Occupational History  . Not on file  Social Needs  . Financial resource strain: Not on file  . Food insecurity:    Worry: Not on file    Inability: Not on file  . Transportation needs:    Medical: Not on file    Non-medical: Not on file  Tobacco Use  . Smoking status: Current Some Day Smoker  . Smokeless tobacco: Never Used  . Tobacco comment: Smokes e cigarettes  Substance and Sexual Activity  . Alcohol use: No  . Drug use: Not on file  . Sexual activity: Not on file  Lifestyle  . Physical activity:    Days per week: Not on file    Minutes per session: Not on file  . Stress: Not on file  Relationships  . Social connections:    Talks on phone: Not on file    Gets together: Not on file    Attends religious service: Not on file    Active member of club or organization: Not on file    Attends meetings of clubs or organizations: Not on file    Relationship status: Not on file  Other Topics Concern  . Not on file  Social History Narrative  . Not on file   Past Surgical History:  Procedure Laterality Date  . ABDOMINAL HYSTERECTOMY    . BREAST SURGERY    .  CATARACT EXTRACTION    . CHOLECYSTECTOMY     Past Medical History:  Diagnosis Date  . Arthritis   . Cataract   . COPD (chronic obstructive pulmonary disease) (HCC)   . Factor 5 Leiden mutation, heterozygous (HCC)   . Hypertension   . Myocardial infarction (HCC)    BP (!) 139/55   Pulse 77   Ht 5' 4.5" (1.638 m)   Wt 123 lb 9.6 oz (56.1 kg)   SpO2 95% Comment: o2 via nasal can at 3ltrs  BMI 20.89 kg/m   Opioid Risk Score:   Fall Risk Score:  `1  Depression screen PHQ 2/9  Depression screen [facility] 2/9 06/20/2018 04/04/2018 11/01/2017 07/25/2017 06/13/2017 03/08/2017 06/11/2016  Decreased Interest 0 0 0 0 0 0 0  Down, Depressed, Hopeless 0 0 0 0 0 0 0  PHQ - 2 Score 0 0 0 0 0 0 0  Altered sleeping - - - - - - -  Tired, decreased  energy - - - - - - -  Change in appetite - - - - - - -  Feeling bad or failure about yourself  - - - - - - -  Trouble concentrating - - - - - - -  Moving slowly or fidgety/restless - - - - - - -  Suicidal thoughts - - - - - - -  PHQ-9 Score - - - - - - -     Review of Systems  Constitutional: Positive for appetite change and unexpected weight change.  HENT: Negative.   Eyes: Negative.   Respiratory: Positive for shortness of breath and wheezing.   Cardiovascular: Negative.   Gastrointestinal: Positive for abdominal pain, constipation and nausea.  Endocrine: Negative.   Genitourinary: Negative.   Musculoskeletal: Positive for arthralgias, gait problem and myalgias.  Skin: Negative.   Allergic/Immunologic: Negative.   Hematological: Bruises/bleeds easily.  Psychiatric/Behavioral: Negative.   All other systems reviewed and are negative.      Objective:   Physical Exam  Constitutional: She is oriented to person, place, and time. She appears well-developed and well-nourished.  HENT:  Head: Normocephalic and atraumatic.  Eyes: Pupils are equal, round, and reactive to light. EOM are normal.  Neurological: She is alert and oriented to person, place, and time.  Nursing note and vitals reviewed.  No tenderness palpation along the cervical thoracic or lumbar paraspinal region. Patient has limited lumbar range of motion approximately 50% flexion extension lateral bending or rotation.  Motor strength is 4- bilateral hip flexor knee extensor 5/5 ankle dorsiflexor patient has difficulty going from sitting to standing due to lower extremity weakness Negative straight leg raising Deep tendon reflexes 2+ bilateral knees 1+ bilateral ankles Pinprick exam intact bilateral L3-L4 L5-S1 fusion      Assessment & Plan:  1.  Lumbar spinal stenosis without neurogenic claudication she has what sounds like intermittent radicular problem mainly in the morning however this is not a persistent issue  and she has no signs of this on her physical examination. We discussed other treatment options such as using buprenorphine or tapentadol however the cost of these medications is high because of the tear status on her insurance plan.  These would be options that are less constipating We will maintain her on her usual dose of narcotic analgesic Hydrocodone 5mg  q 8 hr No signs of abuse Pill counts appropriate PMP aware reviewed , on chronic benzo per PCP no other red flag UDS 01/10/18 appropriate

## 2018-08-11 NOTE — Patient Instructions (Addendum)
  Ask PT about tub transfer bench  Also Ask PT for exercises to strengthen  Hip Flexors Hip Extensors Knee extensors

## 2018-09-10 ENCOUNTER — Encounter: Payer: Medicare Other | Admitting: Registered Nurse

## 2018-09-12 ENCOUNTER — Ambulatory Visit: Payer: Medicare Other | Admitting: Registered Nurse

## 2018-09-15 ENCOUNTER — Encounter: Payer: Self-pay | Admitting: Registered Nurse

## 2018-09-15 ENCOUNTER — Encounter: Payer: Medicare Other | Attending: Physical Medicine & Rehabilitation | Admitting: Registered Nurse

## 2018-09-15 ENCOUNTER — Other Ambulatory Visit: Payer: Self-pay

## 2018-09-15 VITALS — BP 146/70 | HR 77 | Ht 64.0 in | Wt 124.2 lb

## 2018-09-15 DIAGNOSIS — M5416 Radiculopathy, lumbar region: Secondary | ICD-10-CM

## 2018-09-15 DIAGNOSIS — M353 Polymyalgia rheumatica: Secondary | ICD-10-CM | POA: Diagnosis not present

## 2018-09-15 DIAGNOSIS — Z79899 Other long term (current) drug therapy: Secondary | ICD-10-CM | POA: Diagnosis not present

## 2018-09-15 DIAGNOSIS — G894 Chronic pain syndrome: Secondary | ICD-10-CM

## 2018-09-15 DIAGNOSIS — M48061 Spinal stenosis, lumbar region without neurogenic claudication: Secondary | ICD-10-CM

## 2018-09-15 DIAGNOSIS — Z5181 Encounter for therapeutic drug level monitoring: Secondary | ICD-10-CM

## 2018-09-15 MED ORDER — HYDROCODONE-ACETAMINOPHEN 5-325 MG PO TABS: 1.0000 | ORAL_TABLET | Freq: Three times a day (TID) | ORAL | 0 refills | Status: DC | PRN

## 2018-09-15 NOTE — Progress Notes (Signed)
Subjective:    Patient ID: [name], female    DOB: Apr 16, 1946, 72 y.o.   MRN: [id number]  HPI: [name] is a 72 year old female who returns for follow up appointment for chronic pain and medication refill. She states her pain is located in her lower back and occasionally radiating into her right lower extremity. She rates her pain 6. Her current exercise regime is walking.   Daughter reports [name] was having episodes of symptomatic hypotension, she was seen by cardiloogy and amlodipine was discontinued. She remains on carvedilol, she reports.  [name] Morphine Equivalent is 14.66 MME. She is also prescribed Alprazolam  by Dr. Virginia Rochester. We have discussed the black box warning of using opioids and benzodiazepines. I highlighted the dangers of using these drugs together and discussed the adverse events including respiratory suppression, overdose, cognitive impairment and importance of compliance with current regimen. We will continue to monitor and adjust as indicated.   Last Oral Swab was Performed on 01/10/2018, it was consistent.   Pain Inventory Average Pain 6 Pain Right Now 6 My pain is sharp and aching  In the last 24 hours, has pain interfered with the following? General activity 6 Relation with others 4 Enjoyment of life 6 What TIME of day is your pain at its worst? morning daytime Sleep (in general) Good  Pain is worse with: walking, bending and standing Pain improves with: rest and medication Relief from Meds: 7  Mobility walk without assistance ability to climb steps?  yes do you drive?  yes  Function retired I need assistance with the following:  bathing, meal prep and household duties  Neuro/Psych bladder control problems weakness numbness tingling trouble walking spasms anxiety  Prior Studies Any changes since last visit?  no  Physicians involved in your care Any changes since last visit?  no Primary care Dr.  Virginia Rochester Rheumatologist Dr. Herma Carson   Family History  Problem Relation Age of Onset  . Heart disease Father   . Factor V Leiden deficiency Father   . Factor V Leiden deficiency Brother   . Factor V Leiden deficiency Daughter    Social History   Socioeconomic History  . Marital status: Married    Spouse name: Not on file  . Number of children: Not on file  . Years of education: Not on file  . Highest education level: Not on file  Occupational History  . Not on file  Social Needs  . Financial resource strain: Not on file  . Food insecurity:    Worry: Not on file    Inability: Not on file  . Transportation needs:    Medical: Not on file    Non-medical: Not on file  Tobacco Use  . Smoking status: Current Some Day Smoker  . Smokeless tobacco: Never Used  . Tobacco comment: Smokes e cigarettes  Substance and Sexual Activity  . Alcohol use: No  . Drug use: Not on file  . Sexual activity: Not on file  Lifestyle  . Physical activity:    Days per week: Not on file    Minutes per session: Not on file  . Stress: Not on file  Relationships  . Social connections:    Talks on phone: Not on file    Gets together: Not on file    Attends religious service: Not on file    Active member of club or organization: Not on file    Attends meetings of clubs or organizations: Not on  file    Relationship status: Not on file  Other Topics Concern  . Not on file  Social History Narrative  . Not on file   Past Surgical History:  Procedure Laterality Date  . ABDOMINAL HYSTERECTOMY    . BREAST SURGERY    . CATARACT EXTRACTION    . CHOLECYSTECTOMY     Past Medical History:  Diagnosis Date  . Arthritis   . Cataract   . COPD (chronic obstructive pulmonary disease) (HCC)   . Factor 5 Leiden mutation, heterozygous (HCC)   . Hypertension   . Myocardial infarction (HCC)    BP (!) 146/70   Pulse 77   Ht 5\' 4"  (1.626 m)   Wt 124 lb 3.2 oz (56.3 kg)   SpO2 93%   BMI 21.32 kg/m   Opioid Risk  Score:   Fall Risk Score:  `1  Depression screen PHQ 2/9  Depression screen [facility] 2/9 09/15/2018 06/20/2018 04/04/2018 11/01/2017 07/25/2017 06/13/2017 03/08/2017  Decreased Interest 1 0 0 0 0 0 0  Down, Depressed, Hopeless 0 0 0 0 0 0 0  PHQ - 2 Score 1 0 0 0 0 0 0  Altered sleeping 0 - - - - - -  Tired, decreased energy 1 - - - - - -  Change in appetite 1 - - - - - -  Feeling bad or failure about yourself  0 - - - - - -  Trouble concentrating 0 - - - - - -  Moving slowly or fidgety/restless 0 - - - - - -  Suicidal thoughts 0 - - - - - -  PHQ-9 Score 3 - - - - - -     Review of Systems  Constitutional: Positive for appetite change and unexpected weight change.  HENT: Negative.   Eyes: Negative.   Respiratory: Positive for shortness of breath.   Cardiovascular: Negative.   Gastrointestinal: Positive for abdominal pain, constipation and nausea.  Endocrine: Negative.   Genitourinary: Negative.   Musculoskeletal: Negative.   Skin: Negative.   Allergic/Immunologic: Negative.   Neurological: Negative.   Hematological: Negative.   Psychiatric/Behavioral: Negative.   All other systems reviewed and are negative.      Objective:   Physical Exam  Constitutional: She is oriented to person, place, and time. She appears well-developed and well-nourished.  HENT:  Head: Normocephalic and atraumatic.  Neck: Normal range of motion. Neck supple.  Cardiovascular: Normal rate and regular rhythm.  Pulmonary/Chest: Effort normal and breath sounds normal.  Musculoskeletal:  Normal Muscle Bulk and Muscle Testing Reveals: Upper Extremities: Full ROM and Muscle Strength 5/5 Back without spinal tenderness Lower Extremities: Full ROM and Muscle Strength 5/5 Arises from Table with ease Narrow Based Gait  Neurological: She is alert and oriented to person, place, and time.  Skin: Skin is warm and dry.  Psychiatric: She has a normal mood and affect. Her behavior is normal.  Nursing note and vitals  reviewed.         Assessment & Plan:  1. History of lumbar spinal stenosis/ Right Lumbar Radiculitis.Continue current Medication regime and Encouraged to increase HEP as Tolerated.Continue to Monitor: 09/15/2018 Refilled:Hydrocodone 5/325mg  one tablet every 8 hours, may take an extra tablet when pain is severe.#85  2. Recent left occipital infarct, history of factor V 5 Leiden deficiency: On coumadin. Continue to monitor.09/15/2018 3. Fibromyalgia syndrome:Encouraged to increase HEP as tolerated. 09/15/2018. 4. Polymyalgia rheumatica: Rheumatology Following.09/15/2018 5. Chronic pain syndrome:Continue current medication Regimen .Continue with exercise, heat,  and ice therapy. 09/15/2018 6. Nicotine Dependence: Encouraged Smoking Cessation.09/15/2018 7. Hereditary and Idiopathic Peripheral Neuropathy: Continue to Monitor, Effexor discontinued due to elevated Liver Profile per PCP.10/28/2019Continue HEP as tolerated and current medication regime. 09/15/2018 8. Depression: She was prescribed Elavil by Gastroenterologis. No suicidal ideation. Has Family Support. Continue to Monitor.09/15/2018 9. Poor Appetite/ Frail: Appetite improving, she reports. GI Following.09/15/2018.   20 minutes of face to face patient care time was spent during this visit. All questions were encouraged and answered.   F/U in 1 month

## 2018-10-10 ENCOUNTER — Encounter: Payer: Medicare Other | Admitting: Registered Nurse

## 2018-10-23 ENCOUNTER — Encounter: Payer: Medicare Other | Attending: Physical Medicine & Rehabilitation | Admitting: Registered Nurse

## 2018-10-23 ENCOUNTER — Encounter: Payer: Self-pay | Admitting: Registered Nurse

## 2018-10-23 VITALS — BP 149/81 | HR 75 | Ht 63.0 in | Wt 129.0 lb

## 2018-10-23 DIAGNOSIS — G609 Hereditary and idiopathic neuropathy, unspecified: Secondary | ICD-10-CM

## 2018-10-23 DIAGNOSIS — M7062 Trochanteric bursitis, left hip: Secondary | ICD-10-CM

## 2018-10-23 DIAGNOSIS — M5416 Radiculopathy, lumbar region: Secondary | ICD-10-CM | POA: Diagnosis not present

## 2018-10-23 DIAGNOSIS — Z79899 Other long term (current) drug therapy: Secondary | ICD-10-CM

## 2018-10-23 DIAGNOSIS — M48061 Spinal stenosis, lumbar region without neurogenic claudication: Secondary | ICD-10-CM

## 2018-10-23 DIAGNOSIS — M353 Polymyalgia rheumatica: Secondary | ICD-10-CM

## 2018-10-23 DIAGNOSIS — M25511 Pain in right shoulder: Secondary | ICD-10-CM

## 2018-10-23 DIAGNOSIS — Z5181 Encounter for therapeutic drug level monitoring: Secondary | ICD-10-CM

## 2018-10-23 DIAGNOSIS — G894 Chronic pain syndrome: Secondary | ICD-10-CM | POA: Diagnosis not present

## 2018-10-23 DIAGNOSIS — G8929 Other chronic pain: Secondary | ICD-10-CM

## 2018-10-23 DIAGNOSIS — M7061 Trochanteric bursitis, right hip: Secondary | ICD-10-CM

## 2018-10-23 DIAGNOSIS — M25512 Pain in left shoulder: Secondary | ICD-10-CM

## 2018-10-23 MED ORDER — HYDROCODONE-ACETAMINOPHEN 5-325 MG PO TABS: 1.0000 | ORAL_TABLET | Freq: Three times a day (TID) | ORAL | 0 refills | Status: DC | PRN

## 2018-10-23 NOTE — Progress Notes (Signed)
Subjective:    Patient ID: [name], female    DOB: February 15, 1946, 72 y.o.   MRN: [id number]  HPI: [name] is a 72 y.o. female who returns for follow up appointment for chronic pain and medication refill.She states her pain is located in her bilateral shoulders, lower back and bilateral hips. She rates her  pain 1. Her current exercise regime is walking.  [name] Morphine equivalent is 14.66 MME. She is also prescribed  by Dr. Virginia Rochesterrr. We have discussed the black box warning of using opioids and benzodiazepines. I highlighted the dangers of using these drugs together and discussed the adverse events including respiratory suppression, overdose, cognitive impairment and importance of compliance with current regimen. We will continue to monitor and adjust as indicated.   Last Oral Swab was Performed 01/10/2018, was consistent.    Pain Inventory Average Pain 6 Pain Right Now 1 My pain is constant, tingling and aching  In the last 24 hours, has pain interfered with the following? General activity 6 Relation with others 3 Enjoyment of life 4 What TIME of day is your pain at its worst? daytime Sleep (in general) Good  Pain is worse with: walking, bending and standing Pain improves with: heat/ice, pacing activities and medication Relief from Meds: 7  Mobility walk without assistance do you drive?  yes  Function retired I need assistance with the following:  bathing, meal prep, household duties and shopping  Neuro/Psych bladder control problems weakness numbness tingling trouble walking  Prior Studies Any changes since last visit?  no  Physicians involved in your care Any changes since last visit?  no   Family History  Problem Relation Age of Onset  . Heart disease Father   . Factor V Leiden deficiency Father   . Factor V Leiden deficiency Brother   . Factor V Leiden deficiency Daughter    Social History   Socioeconomic History  . Marital status:  Married    Spouse name: Not on file  . Number of children: Not on file  . Years of education: Not on file  . Highest education level: Not on file  Occupational History  . Not on file  Social Needs  . Financial resource strain: Not on file  . Food insecurity:    Worry: Not on file    Inability: Not on file  . Transportation needs:    Medical: Not on file    Non-medical: Not on file  Tobacco Use  . Smoking status: Current Some Day Smoker  . Smokeless tobacco: Never Used  . Tobacco comment: Smokes e cigarettes  Substance and Sexual Activity  . Alcohol use: No  . Drug use: Not on file  . Sexual activity: Not on file  Lifestyle  . Physical activity:    Days per week: Not on file    Minutes per session: Not on file  . Stress: Not on file  Relationships  . Social connections:    Talks on phone: Not on file    Gets together: Not on file    Attends religious service: Not on file    Active member of club or organization: Not on file    Attends meetings of clubs or organizations: Not on file    Relationship status: Not on file  Other Topics Concern  . Not on file  Social History Narrative  . Not on file   Past Surgical History:  Procedure Laterality Date  . ABDOMINAL HYSTERECTOMY    . BREAST SURGERY    .  CATARACT EXTRACTION    . CHOLECYSTECTOMY     Past Medical History:  Diagnosis Date  . Arthritis   . Cataract   . COPD (chronic obstructive pulmonary disease) (HCC)   . Factor 5 Leiden mutation, heterozygous (HCC)   . Hypertension   . Myocardial infarction (HCC)    There were no vitals taken for this visit.  Opioid Risk Score:   Fall Risk Score:  `1  Depression screen PHQ 2/9  Depression screen [facility] 2/9 09/15/2018 06/20/2018 04/04/2018 11/01/2017 07/25/2017 06/13/2017 03/08/2017  Decreased Interest 1 0 0 0 0 0 0  Down, Depressed, Hopeless 0 0 0 0 0 0 0  PHQ - 2 Score 1 0 0 0 0 0 0  Altered sleeping 0 - - - - - -  Tired, decreased energy 1 - - - - - -  Change in  appetite 1 - - - - - -  Feeling bad or failure about yourself  0 - - - - - -  Trouble concentrating 0 - - - - - -  Moving slowly or fidgety/restless 0 - - - - - -  Suicidal thoughts 0 - - - - - -  PHQ-9 Score 3 - - - - - -     Review of Systems  Constitutional: Positive for appetite change and unexpected weight change.  HENT: Negative.   Eyes: Negative.   Respiratory: Positive for shortness of breath.   Cardiovascular: Negative.   Gastrointestinal: Positive for abdominal pain, constipation and nausea.  Endocrine: Negative.   Genitourinary: Positive for difficulty urinating.  Musculoskeletal: Positive for arthralgias, back pain, gait problem and myalgias.  Skin: Negative.   Allergic/Immunologic: Negative.   Neurological: Positive for weakness and numbness.  Hematological: Bruises/bleeds easily.  Psychiatric/Behavioral: Negative.   All other systems reviewed and are negative.      Objective:   Physical Exam  Constitutional: She is oriented to person, place, and time. She appears well-developed and well-nourished.  HENT:  Head: Normocephalic and atraumatic.  Neck: Normal range of motion. Neck supple.  Cardiovascular: Normal rate and regular rhythm.  Pulmonary/Chest: Effort normal and breath sounds normal.  Continuous Oxygen 3 Liters nasal cannula   Musculoskeletal:  Normal Muscle Bulk and Muscle Testing Reveals: Upper Extremities: Full ROM and Muscle Strength 5/5 Back without spinal tenderness Lower Extremities: Full ROM and Muscle Strength 5/5 Arises from Table with ease Narrow Based Gait   Neurological: She is alert and oriented to person, place, and time.  Skin: Skin is warm and dry.  Psychiatric: She has a normal mood and affect. Her behavior is normal.  Nursing note and vitals reviewed.         Assessment & Plan:  1. History of lumbar spinal stenosis/ Right Lumbar Radiculitis.Continue current Medication regime and Encouraged to increase HEP as  Tolerated.Continue to Monitor: 10/23/2018 Refilled:Hydrocodone 5/325mg  one tablet every 8 hours, may take an extra tablet when pain is severe.#85  2. Recent left occipital infarct, history of factor V 5 Leiden deficiency: On coumadin. Continue to monitor.10/23/2018 3. Fibromyalgia syndrome:Encouraged to increase HEP as tolerated. 10/23/2018. 4. Polymyalgia rheumatica: Rheumatology Following.10/23/2018 5. Chronic pain syndrome:Continue current medication Regimen .Continue with exercise, heat, and ice therapy. 10/23/2018 6. Nicotine Dependence: Encouraged Smoking Cessation.10/23/2018 7. Hereditary and Idiopathic Peripheral Neuropathy: Continue to Monitor, Effexor discontinued due to elevated Liver Profile per PCP.10/28/2019Continue HEP as tolerated and current medication regime. 10/23/2018 8. Depression: She was prescribed Elavil by Gastroenterologis. No suicidal ideation. Has Family Support. Continue to Monitor.10/23/2018 9.  Poor Appetite/ Frail: Appetite improving, she reports. GI Following.10/23/2018.  10. Chronic Bilateral Shoulders: Continue HEP as Tolerated. Continue to Monitor.  11. Bilateral Greater Trochanteric Bursitis: Continue to Alternate Ice and Heat Therapy.   20 minutes of face to face patient care time was spent during this visit. All questions were encouraged and answered.   F/U in 1 month

## 2018-11-28 ENCOUNTER — Encounter: Payer: Medicare Other | Attending: Physical Medicine & Rehabilitation | Admitting: Registered Nurse

## 2018-11-28 ENCOUNTER — Encounter: Payer: Self-pay | Admitting: Registered Nurse

## 2018-11-28 VITALS — BP 144/74 | HR 67 | Resp 14 | Ht 63.0 in | Wt 128.0 lb

## 2018-11-28 DIAGNOSIS — Z5181 Encounter for therapeutic drug level monitoring: Secondary | ICD-10-CM | POA: Diagnosis not present

## 2018-11-28 DIAGNOSIS — M48061 Spinal stenosis, lumbar region without neurogenic claudication: Secondary | ICD-10-CM

## 2018-11-28 DIAGNOSIS — Z79899 Other long term (current) drug therapy: Secondary | ICD-10-CM | POA: Insufficient documentation

## 2018-11-28 DIAGNOSIS — M353 Polymyalgia rheumatica: Secondary | ICD-10-CM | POA: Insufficient documentation

## 2018-11-28 DIAGNOSIS — M5416 Radiculopathy, lumbar region: Secondary | ICD-10-CM | POA: Diagnosis not present

## 2018-11-28 DIAGNOSIS — M7061 Trochanteric bursitis, right hip: Secondary | ICD-10-CM

## 2018-11-28 DIAGNOSIS — G609 Hereditary and idiopathic neuropathy, unspecified: Secondary | ICD-10-CM

## 2018-11-28 DIAGNOSIS — M542 Cervicalgia: Secondary | ICD-10-CM

## 2018-11-28 DIAGNOSIS — M25511 Pain in right shoulder: Secondary | ICD-10-CM

## 2018-11-28 DIAGNOSIS — M7062 Trochanteric bursitis, left hip: Secondary | ICD-10-CM

## 2018-11-28 DIAGNOSIS — G894 Chronic pain syndrome: Secondary | ICD-10-CM | POA: Diagnosis not present

## 2018-11-28 DIAGNOSIS — M25512 Pain in left shoulder: Secondary | ICD-10-CM

## 2018-11-28 DIAGNOSIS — G8929 Other chronic pain: Secondary | ICD-10-CM

## 2018-11-28 MED ORDER — HYDROCODONE-ACETAMINOPHEN 5-325 MG PO TABS: 1.0000 | ORAL_TABLET | Freq: Three times a day (TID) | ORAL | 0 refills | Status: DC | PRN

## 2018-11-28 NOTE — Progress Notes (Signed)
Subjective:    Patient ID: [name], female    DOB: 1946/07/12, 73 y.o.   MRN: [id number]  HPI: [name] is a 73 y.o. female who returns for follow up appointment for chronic pain and medication refill. She states her pain is located in her neck, .left shoulder, lower back and bilateral hips. She rates her pain 5. Her current exercise regime is walking.  [name] Morphine equivalent is 14.66 MME. She is also prescribed Alprazolam by Dr. Virginia Rochester. We have discussed the black box warning of using opioids and benzodiazepines. I highlighted the dangers of using these drugs together and discussed the adverse events including respiratory suppression, overdose, cognitive impairment and importance of compliance with current regimen. We will continue to monitor and adjust as indicated.   Last Oral Swab was Performed on 01/10/2018, it was consistent.  Daughter in room.  Pain Inventory Average Pain 5 Pain Right Now 5 My pain is tingling and aching  In the last 24 hours, has pain interfered with the following? General activity 6 Relation with others 3 Enjoyment of life 4 What TIME of day is your pain at its worst? morning, daytime Sleep (in general) Good  Pain is worse with: walking, bending, standing and some activites Pain improves with: rest, heat/ice, pacing activities and medication Relief from Meds: 7  Mobility walk without assistance ability to climb steps?  yes do you drive?  yes  Function I need assistance with the following:  bathing, meal prep, household duties and shopping  Neuro/Psych weakness numbness tingling trouble walking spasms anxiety  Prior Studies Any changes since last visit?  no  Physicians involved in your care Any changes since last visit?  no   Family History  Problem Relation Age of Onset  . Heart disease Father   . Factor V Leiden deficiency Father   . Factor V Leiden deficiency Brother   . Factor V Leiden deficiency Daughter      Social History   Socioeconomic History  . Marital status: Married    Spouse name: Not on file  . Number of children: Not on file  . Years of education: Not on file  . Highest education level: Not on file  Occupational History  . Not on file  Social Needs  . Financial resource strain: Not on file  . Food insecurity:    Worry: Not on file    Inability: Not on file  . Transportation needs:    Medical: Not on file    Non-medical: Not on file  Tobacco Use  . Smoking status: Current Some Day Smoker  . Smokeless tobacco: Never Used  . Tobacco comment: Smokes e cigarettes  Substance and Sexual Activity  . Alcohol use: No  . Drug use: Not on file  . Sexual activity: Not on file  Lifestyle  . Physical activity:    Days per week: Not on file    Minutes per session: Not on file  . Stress: Not on file  Relationships  . Social connections:    Talks on phone: Not on file    Gets together: Not on file    Attends religious service: Not on file    Active member of club or organization: Not on file    Attends meetings of clubs or organizations: Not on file    Relationship status: Not on file  Other Topics Concern  . Not on file  Social History Narrative  . Not on file   Past Surgical History:  Procedure Laterality Date  . ABDOMINAL HYSTERECTOMY    . BREAST SURGERY    . CATARACT EXTRACTION    . CHOLECYSTECTOMY     Past Medical History:  Diagnosis Date  . Arthritis   . Cataract   . COPD (chronic obstructive pulmonary disease) (HCC)   . Factor 5 Leiden mutation, heterozygous (HCC)   . Hypertension   . Myocardial infarction (HCC)    Ht 5\' 3"  (1.6 m)   Wt 128 lb (58.1 kg)   BMI 22.67 kg/m   Opioid Risk Score:   Fall Risk Score:  `1  Depression screen PHQ 2/9  [facility] 09/15/2018 06/20/2018 04/04/2018 11/01/2017 07/25/2017 06/13/2017 03/08/2017  Decreased Interest 1 0 0 0 0 0 0  Down, Depressed, Hopeless 0 0 0 0 0 0 0  PHQ - 2 Score 1 0 0 0 0 0 0   Altered sleeping 0 - - - - - -  Tired, decreased energy 1 - - - - - -  Change in appetite 1 - - - - - -  Feeling bad or failure about yourself  0 - - - - - -  Trouble concentrating 0 - - - - - -  Moving slowly or fidgety/restless 0 - - - - - -  Suicidal thoughts 0 - - - - - -  PHQ-9 Score 3 - - - - - -    Review of Systems  Constitutional: Positive for unexpected weight change.  HENT: Negative.   Eyes: Negative.   Respiratory: Positive for shortness of breath.   Cardiovascular: Negative.   Gastrointestinal: Positive for constipation and nausea.  Endocrine: Negative.   Musculoskeletal: Positive for back pain, gait problem and neck pain.       Spasms   Skin: Negative.   Allergic/Immunologic: Negative.   Neurological: Positive for weakness and numbness.       Tingling  Hematological: Bruises/bleeds easily.  Psychiatric/Behavioral: The patient is nervous/anxious.   All other systems reviewed and are negative.      Objective:   Physical Exam Vitals signs and nursing note reviewed.  Constitutional:      Appearance: Normal appearance.  Neck:     Musculoskeletal: Normal range of motion and neck supple.  Cardiovascular:     Rate and Rhythm: Normal rate and regular rhythm.     Pulses: Normal pulses.     Heart sounds: Normal heart sounds.  Pulmonary:     Effort: Pulmonary effort is normal.     Breath sounds: Normal breath sounds.  Musculoskeletal:     Comments: Normal Muscle Bulk and Muscle Testing Reveals:  Upper Extremities: Right: Decreased ROM 45 Degrees and Muscle Strength 5/5 Left: Full ROM and Muscle Strength 5/5 Bilateral AC Joint Tenderness  Lumbar Paraspinal Tenderness: L-3-L-5 Lower Extremities: Full ROM and Muscle Strength 5/5 Arises from Table Slowly  Narrow Based Gait   Skin:    General: Skin is warm and dry.  Neurological:     Mental Status: She is alert and oriented to person, place, and time.  Psychiatric:        Mood and Affect: Mood normal.         Behavior: Behavior normal.           Assessment & Plan:  1. History of lumbar spinal stenosis/ Right Lumbar Radiculitis.Continue current Medication regime and Encouraged to increase HEP as Tolerated.Continue to Monitor: 11/28/2018 Refilled:Hydrocodone 5/325mg  one tablet every 8 hours, may take an extra tablet when pain is severe.#85  2. Recent left occipital infarct, history of factor V 5 Leiden deficiency: On coumadin. Continue to monitor.11/28/2018 3. Fibromyalgia syndrome:Encouraged to increase HEP as tolerated.11/28/2018 4. Polymyalgia rheumatica: Rheumatology Following.11/28/2018 5. Chronic pain syndrome:Continue current medication Regimen .Continue with exercise, heat, and ice therapy.11/28/2018 6. Nicotine Dependence: Encouraged Smoking Cessation.11/28/2018 7. Hereditary and Idiopathic Peripheral Neuropathy: Continue to Monitor, Effexor discontinued due to elevated Liver Profile per PCP.Continue HEP as tolerated and current medication regime.11/28/2018 8. Depression: She was prescribed Elavil by Gastroenterologis. No suicidal ideation. Has Family Support. Continue to Monitor.11/28/2018 9. Poor Appetite/ Frail:Appetite improving, she reports.GI Following.11/28/2018. 10. Chronic Bilateral Shoulders: Continue HEP as Tolerated. Continue to Monitor.11/28/2018  11. Bilateral Greater Trochanteric Bursitis: Continue to Alternate Ice and Heat Therapy. 11/28/2018 12. Cervicalgia: Continue HEP as Tolerated. Continue to Alternate Heat and Ice Therapy. Continue to Monitor.   20 minutes of face to face patient care time was spent during this visit. All questions were encouraged and answered.   F/U in 1 month

## 2018-12-26 ENCOUNTER — Encounter: Payer: Self-pay | Admitting: Registered Nurse

## 2018-12-26 ENCOUNTER — Encounter: Payer: Medicare Other | Attending: Physical Medicine & Rehabilitation | Admitting: Registered Nurse

## 2018-12-26 VITALS — BP 171/80 | HR 80 | Ht 64.0 in | Wt 130.0 lb

## 2018-12-26 DIAGNOSIS — M5416 Radiculopathy, lumbar region: Secondary | ICD-10-CM

## 2018-12-26 DIAGNOSIS — Z79899 Other long term (current) drug therapy: Secondary | ICD-10-CM | POA: Diagnosis not present

## 2018-12-26 DIAGNOSIS — G894 Chronic pain syndrome: Secondary | ICD-10-CM

## 2018-12-26 DIAGNOSIS — Z5181 Encounter for therapeutic drug level monitoring: Secondary | ICD-10-CM | POA: Diagnosis not present

## 2018-12-26 DIAGNOSIS — M353 Polymyalgia rheumatica: Secondary | ICD-10-CM

## 2018-12-26 DIAGNOSIS — M25511 Pain in right shoulder: Secondary | ICD-10-CM

## 2018-12-26 DIAGNOSIS — M48061 Spinal stenosis, lumbar region without neurogenic claudication: Secondary | ICD-10-CM | POA: Diagnosis not present

## 2018-12-26 DIAGNOSIS — G8929 Other chronic pain: Secondary | ICD-10-CM

## 2018-12-26 MED ORDER — HYDROCODONE-ACETAMINOPHEN 5-325 MG PO TABS: 1.0000 | ORAL_TABLET | Freq: Three times a day (TID) | ORAL | 0 refills | Status: DC | PRN

## 2018-12-26 NOTE — Progress Notes (Signed)
Subjective:    Patient ID: [name], female    DOB: 16-May-1946, 74 y.o.   MRN: [id number]  HPI: [name] is a 73 y.o. female who returns for follow up appointment for chronic pain and medication refill. She states her pain is located in her right shoulder and  lower back radiating into her right lower extremity. She rates her pain 6. Her current exercise regime is walking and performing stretching exercises.  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 Meyer Russel is following and she has been referred to Dr. Raynald Blend for surgical evaluation she states. Ms. Pelland currently on antibiotics.   Ms. [place] Morphine equivalent is 14.66  MME. She is also prescribed Alprazolam  by Dr. Virginia Rochester. We have discussed the black box warning of using opioids and benzodiazepines. I highlighted the dangers of using these drugs together and discussed the adverse events including respiratory suppression, overdose, cognitive impairment and importance of compliance with current regimen. We will continue to monitor and adjust as indicated.   Last UDS was Performed on 01/10/2018, it was consistent. UDS was Performed today.   Pain Inventory Average Pain 6 Pain Right Now 6 My pain is constant, dull, stabbing, tingling and aching  In the last 24 hours, has pain interfered with the following? General activity 5 Relation with others 3 Enjoyment of life 5 What TIME of day is your pain at its worst? daytime Sleep (in general) Good  Pain is worse with: walking, bending and standing Pain improves with: rest, heat/ice, pacing activities and medication Relief from Meds: 6  Mobility walk without assistance ability to climb steps?  yes do you drive?  yes  Function retired I need assistance with the following:  bathing, meal prep, household duties and shopping  Neuro/Psych weakness numbness tingling trouble  walking spasms anxiety  Prior Studies Any changes since last visit?  no  Physicians involved in your care Any changes since last visit?  no   Family History  Problem Relation Age of Onset  . Heart disease Father   . Factor V Leiden deficiency Father   . Factor V Leiden deficiency Brother   . Factor V Leiden deficiency Daughter    Social History   Socioeconomic History  . Marital status: Married    Spouse name: Not on file  . Number of children: Not on file  . Years of education: Not on file  . Highest education level: Not on file  Occupational History  . Not on file  Social Needs  . Financial resource strain: Not on file  . Food insecurity:    Worry: Not on file    Inability: Not on file  . Transportation needs:    Medical: Not on file    Non-medical: Not on file  Tobacco Use  . Smoking status: Current Some Day Smoker  . Smokeless tobacco: Never Used  . Tobacco comment: Smokes e cigarettes  Substance and Sexual Activity  . Alcohol use: No  . Drug use: Not on file  . Sexual activity: Not on file  Lifestyle  . Physical activity:    Days per week: Not on file    Minutes per session: Not on file  . Stress: Not on file  Relationships  . Social connections:    Talks on phone: Not on file    Gets together: Not on file    Attends religious service: Not on file    Active member of  club or organization: Not on file    Attends meetings of clubs or organizations: Not on file    Relationship status: Not on file  Other Topics Concern  . Not on file  Social History Narrative  . Not on file   Past Surgical History:  Procedure Laterality Date  . ABDOMINAL HYSTERECTOMY    . BREAST SURGERY    . CATARACT EXTRACTION    . CHOLECYSTECTOMY     Past Medical History:  Diagnosis Date  . Arthritis   . Cataract   . COPD (chronic obstructive pulmonary disease) (HCC)   . Factor 5 Leiden mutation, heterozygous (HCC)   . Hypertension   . Myocardial infarction (HCC)    BP  (!) 171/80   Pulse 80   Ht 5\' 4"  (1.626 m)   Wt 130 lb (59 kg)   SpO2 96% Comment: 3ltrs O2 via nasal can  BMI 22.31 kg/m   Opioid Risk Score:   Fall Risk Score:  `1  Depression screen PHQ 2/9  [facility] 09/15/2018 06/20/2018 04/04/2018 11/01/2017 07/25/2017 06/13/2017 03/08/2017  Decreased Interest 1 0 0 0 0 0 0  Down, Depressed, Hopeless 0 0 0 0 0 0 0  PHQ - 2 Score 1 0 0 0 0 0 0  Altered sleeping 0 - - - - - -  Tired, decreased energy 1 - - - - - -  Change in appetite 1 - - - - - -  Feeling bad or failure about yourself  0 - - - - - -  Trouble concentrating 0 - - - - - -  Moving slowly or fidgety/restless 0 - - - - - -  Suicidal thoughts 0 - - - - - -  PHQ-9 Score 3 - - - - - -     Review of Systems  Constitutional: Positive for appetite change and unexpected weight change.  HENT: Negative.   Eyes: Negative.   Respiratory: Negative.   Cardiovascular: Negative.   Gastrointestinal: Positive for abdominal pain, constipation and nausea.  Endocrine: Negative.   Genitourinary: Negative.   Musculoskeletal: Positive for arthralgias and myalgias.  Skin: Negative.   Allergic/Immunologic: Negative.   Neurological: Positive for weakness and numbness.  Hematological: Bruises/bleeds easily.  Psychiatric/Behavioral: Positive for dysphoric mood. The patient is nervous/anxious.   All other systems reviewed and are negative.      Objective:   Physical Exam Vitals signs and nursing note reviewed.  Constitutional:      Appearance: Normal appearance.  Neck:     Musculoskeletal: Normal range of motion and neck supple.  Cardiovascular:     Rate and Rhythm: Normal rate and regular rhythm.     Pulses: Normal pulses.     Heart sounds: Normal heart sounds.  Pulmonary:     Effort: Pulmonary effort is normal.     Breath sounds: Normal breath sounds.     Comments: Continuous Oxygen at 3 Liters nasal cannula Musculoskeletal:     Comments: Normal Muscle Bulk and Muscle  Testing Reveals:  Upper Extremities: Full ROM and Muscle Strength 5/5 Lumbar Paraspinal Tenderness: L-4-L-5 Lower Extremities: Full ROM and Muscle Strength 5/5 Arises from Table Slowly Narrow Based Gait   Skin:    General: Skin is warm and dry.  Neurological:     Mental Status: She is alert and oriented to person, place, and time.           Assessment & Plan:  1. History of lumbar spinal stenosis/ Right Lumbar  Radiculitis.Continue current Medication regime and Encouraged to increase HEP as Tolerated.Continue to Monitor: 12/26/2018 Refilled:Hydrocodone 5/325mg  one tablet every 8 hours, may take an extra tablet when pain is severe.#85  2. Recent left occipital infarct, history of factor V 5 Leiden deficiency: On coumadin. Continue to monitor.12/26/2018 3. Fibromyalgia syndrome:Encouraged to increase HEP as tolerated.12/26/2018 4. Polymyalgia rheumatica: Rheumatology Following.12/26/2018 5. Chronic pain syndrome:Continue current medication Regimen .Continue with exercise, heat, and ice therapy.12/26/2018 6. Nicotine Dependence: Encouraged Smoking Cessation.12/26/2018 7. Hereditary and Idiopathic Peripheral Neuropathy: Continue to Monitor, Effexor discontinued due to elevated Liver Profile per PCP.Continue HEP as tolerated and current medication regime.12/26/2018 8. Depression: She was prescribed Elavil by Gastroenterologis. No suicidal ideation. Has Family Support. Continue to Monitor.12/26/2018 9. Poor Appetite/ Frail:Appetite improving, she reports.GI Following.12/26/2018. 10.Right Shoulder Pain: Continue HEP as Tolerated. Continue to Monitor.12/26/2018  11. Bilateral Greater Trochanteric Bursitis: No complaints today. Continue to Alternate Ice and Heat Therapy.12/26/2018 12. Cervicalgia: No complaints Today.Continue HEP as Tolerated. Continue to Alternate Heat and Ice Therapy. Continue to Monitor. 12/26/2018  20 minutes of face to face patient care time was spent  during this visit. All questions were encouraged and answered.   F/U in 1 month

## 2019-01-01 LAB — TOXASSURE SELECT,+ANTIDEPR,UR

## 2019-01-08 ENCOUNTER — Telehealth: Payer: Self-pay | Admitting: *Deleted

## 2019-01-08 NOTE — Telephone Encounter (Signed)
Urine drug screen for this encounter is consistent for prescribed medication 

## 2019-01-20 ENCOUNTER — Ambulatory Visit: Payer: Medicare Other | Admitting: Physical Medicine & Rehabilitation

## 2019-01-22 ENCOUNTER — Encounter: Payer: Medicare Other | Admitting: Registered Nurse

## 2019-01-30 ENCOUNTER — Encounter: Payer: Medicare Other | Attending: Physical Medicine & Rehabilitation | Admitting: Registered Nurse

## 2019-01-30 ENCOUNTER — Other Ambulatory Visit: Payer: Self-pay

## 2019-01-30 ENCOUNTER — Encounter: Payer: Self-pay | Admitting: Registered Nurse

## 2019-01-30 VITALS — BP 144/67 | HR 78 | Ht 64.0 in | Wt 128.8 lb

## 2019-01-30 DIAGNOSIS — G894 Chronic pain syndrome: Secondary | ICD-10-CM | POA: Diagnosis present

## 2019-01-30 DIAGNOSIS — M353 Polymyalgia rheumatica: Secondary | ICD-10-CM

## 2019-01-30 DIAGNOSIS — M5416 Radiculopathy, lumbar region: Secondary | ICD-10-CM

## 2019-01-30 DIAGNOSIS — M48061 Spinal stenosis, lumbar region without neurogenic claudication: Secondary | ICD-10-CM

## 2019-01-30 DIAGNOSIS — Z5181 Encounter for therapeutic drug level monitoring: Secondary | ICD-10-CM | POA: Diagnosis not present

## 2019-01-30 DIAGNOSIS — Z79899 Other long term (current) drug therapy: Secondary | ICD-10-CM | POA: Diagnosis not present

## 2019-01-30 MED ORDER — HYDROCODONE-ACETAMINOPHEN 5-325 MG PO TABS: 1.0000 | ORAL_TABLET | Freq: Three times a day (TID) | ORAL | 0 refills | Status: DC | PRN

## 2019-01-30 NOTE — Progress Notes (Signed)
Subjective:    Patient ID: [name], female    DOB: [birth year], 73 y.o.   MRN: [id number]  HPI: [name] is a 73 y.o. female who returns for follow up appointment for chronic pain and medication refill. She states her  pain is located in her neck and lower back. She rates her pain 7. Her. current exercise regime is walking and performing stretching exercises.  [name] Morphine equivalent is 14.66 MME.She is also prescribed Alprazolam  by Dr. Virginia Rochester .We have discussed the black box warning of using opioids and benzodiazepines. I highlighted the dangers of using these drugs together and discussed the adverse events including respiratory suppression, overdose, cognitive impairment and importance of compliance with current regimen. We will continue to monitor and adjust as indicated.   Last UDS was Performed on 12/26/2018, it was consistent.    Pain Inventory Average Pain 7 Pain Right Now 7 My pain is constant, sharp, burning, dull, stabbing, tingling and aching  In the last 24 hours, has pain interfered with the following? General activity 6 Relation with others 4 Enjoyment of life 6 What TIME of day is your pain at its worst? morning daytime and evening Sleep (in general) Good  Pain is worse with: walking, bending, sitting, standing and some activites Pain improves with: rest, heat/ice, pacing activities and medication Relief from Meds: 7  Mobility walk without assistance ability to climb steps?  no do you drive?  yes  Function retired I need assistance with the following:  bathing, meal prep, household duties and shopping  Neuro/Psych bladder control problems bowel control problems weakness numbness tingling trouble walking spasms depression anxiety  Prior Studies Any changes since last visit?  no  Physicians involved in your care Any changes since last visit?  no   Family History  Problem Relation Age of Onset   Heart disease Father     Factor V Leiden deficiency Father    Factor V Leiden deficiency Brother    Factor V Leiden deficiency Daughter    Social History   Socioeconomic History   Marital status: Married    Spouse name: Not on file   Number of children: Not on file   Years of education: Not on file   Highest education level: Not on file  Occupational History   Not on file  Social Needs   Financial resource strain: Not on file   Food insecurity:    Worry: Not on file    Inability: Not on file   Transportation needs:    Medical: Not on file    Non-medical: Not on file  Tobacco Use   Smoking status: Current Some Day Smoker   Smokeless tobacco: Never Used   Tobacco comment: Smokes e cigarettes  Substance and Sexual Activity   Alcohol use: No   Drug use: Not on file   Sexual activity: Not on file  Lifestyle   Physical activity:    Days per week: Not on file    Minutes per session: Not on file   Stress: Not on file  Relationships   Social connections:    Talks on phone: Not on file    Gets together: Not on file    Attends religious service: Not on file    Active member of club or organization: Not on file    Attends meetings of clubs or organizations: Not on file    Relationship status: Not on file  Other Topics Concern   Not on file  Social  History Narrative   Not on file   Past Surgical History:  Procedure Laterality Date   ABDOMINAL HYSTERECTOMY     BREAST SURGERY     CATARACT EXTRACTION     CHOLECYSTECTOMY     Past Medical History:  Diagnosis Date   Arthritis    Cataract    COPD (chronic obstructive pulmonary disease) (HCC)    Factor 5 Leiden mutation, heterozygous (HCC)    Hypertension    Myocardial infarction (HCC)    BP (!) 144/67    Pulse 78    Ht 5\' 4"  (1.626 m)    Wt 128 lb 12.8 oz (58.4 kg)    SpO2 92% Comment: 3 L [place]   BMI 22.11 kg/m   Opioid Risk Score:   Fall Risk Score:  `1  Depression screen PHQ 2/9  Depression screen [facility] 2/9  09/15/2018 06/20/2018 04/04/2018 11/01/2017 07/25/2017 06/13/2017 03/08/2017  Decreased Interest 1 0 0 0 0 0 0  Down, Depressed, Hopeless 0 0 0 0 0 0 0  PHQ - 2 Score 1 0 0 0 0 0 0  Altered sleeping 0 - - - - - -  Tired, decreased energy 1 - - - - - -  Change in appetite 1 - - - - - -  Feeling bad or failure about yourself  0 - - - - - -  Trouble concentrating 0 - - - - - -  Moving slowly or fidgety/restless 0 - - - - - -  Suicidal thoughts 0 - - - - - -  PHQ-9 Score 3 - - - - - -    Review of Systems  Constitutional: Positive for appetite change.  HENT: Negative.   Eyes: Negative.   Respiratory: Positive for shortness of breath and wheezing.   Cardiovascular: Negative.   Gastrointestinal: Positive for abdominal pain, constipation and nausea.  Endocrine: Negative.   Genitourinary:       Bladder control  Musculoskeletal: Positive for gait problem.       Spasms  Neurological: Positive for weakness and numbness.       Tingling  Hematological: Bruises/bleeds easily.  Psychiatric/Behavioral: Positive for dysphoric mood. The patient is nervous/anxious.   All other systems reviewed and are negative.      Objective:   Physical Exam Vitals signs and nursing note reviewed.  Constitutional:      Appearance: Normal appearance.  Neck:     Musculoskeletal: Normal range of motion and neck supple.  Cardiovascular:     Rate and Rhythm: Normal rate and regular rhythm.     Pulses: Normal pulses.     Heart sounds: Normal heart sounds.  Pulmonary:     Effort: Pulmonary effort is normal.     Breath sounds: Normal breath sounds.     Comments: Continuous Oxygen 3 Liters nasal cannula Musculoskeletal:     Comments: Normal Muscle Bulk and Muscle Testing Reveals:  Upper Extremities: Right: Decreased ROM 90 Degrees and Muscle Strength 4/5  Left: Full ROM and Muscle Strength 5/5  Lumbar Paraspinal Tenderness: L-4-L-5  Lower Extremities: Full ROM and Muscle Strength 5/5  Arises from table  slowly Narrow Based  Gait   Skin:    General: Skin is warm and dry.  Neurological:     Mental Status: She is alert and oriented to person, place, and time.  Psychiatric:        Mood and Affect: Mood normal.        Behavior: Behavior normal.  Assessment & Plan:  1. History of lumbar spinal stenosis/ Right Lumbar Radiculitis.Continue current Medication regime and Encouraged to increase HEP as Tolerated.Continue to Monitor: 01/30/2019 Refilled:Hydrocodone 5/325mg  one tablet every 8 hours, may take an extra tablet when pain is severe.#85  2. Recent left occipital infarct, history of factor V 5 Leiden deficiency: On coumadin. Continue to monitor.01/30/2019 3. Fibromyalgia syndrome:Encouraged to increase HEP as tolerated.01/30/2019 4. Polymyalgia rheumatica: Rheumatology Following.01/30/2019 5. Chronic pain syndrome:Continue current medication Regimen .Continue with exercise, heat, and ice therapy.01/30/2019 6. Nicotine Dependence: Encouraged Smoking Cessation.01/30/2019 7. Hereditary and Idiopathic Peripheral Neuropathy: Continue to Monitor, Effexor discontinued due to elevated Liver Profile per PCP.Continue HEP as tolerated and current medication regime.01/30/2019 8. Depression: She was prescribed Elavil by [facility]astroenterolog[facility]. No suicidal ideation. Has Family Support. Continue to Monitor.01/30/2019 9. Poor Appetite/ Frail:Appetite improving, she reports.GI Following.01/30/2019. 10.Right Shoulder Pain: Continue HEP as Tolerated. Continue to Monitor.01/30/2019 11. Bilateral Greater Trochanteric Bursitis: No complaints today. Continue to Alternate Ice and Heat Therapy.01/30/2019 12. Cervicalgia: No complaints Today.Continue HEP as Tolerated. Continue to Alternate Heat and Ice Therapy. Continue to Monitor.01/30/2019  20 minutes of face to face patient care time was spent during this visit. All questions were encouraged and answered.   F/U in 1  month

## 2019-02-24 ENCOUNTER — Encounter: Payer: Self-pay | Admitting: Registered Nurse

## 2019-02-24 ENCOUNTER — Other Ambulatory Visit: Payer: Self-pay

## 2019-02-24 ENCOUNTER — Encounter: Payer: Medicare Other | Attending: Physical Medicine & Rehabilitation | Admitting: Registered Nurse

## 2019-02-24 VITALS — BP 134/65 | HR 88 | Ht 64.0 in | Wt 126.0 lb

## 2019-02-24 DIAGNOSIS — M48061 Spinal stenosis, lumbar region without neurogenic claudication: Secondary | ICD-10-CM | POA: Diagnosis not present

## 2019-02-24 DIAGNOSIS — M5416 Radiculopathy, lumbar region: Secondary | ICD-10-CM | POA: Diagnosis not present

## 2019-02-24 DIAGNOSIS — Z79899 Other long term (current) drug therapy: Secondary | ICD-10-CM

## 2019-02-24 DIAGNOSIS — G894 Chronic pain syndrome: Secondary | ICD-10-CM | POA: Diagnosis not present

## 2019-02-24 DIAGNOSIS — F17210 Nicotine dependence, cigarettes, uncomplicated: Secondary | ICD-10-CM

## 2019-02-24 DIAGNOSIS — M353 Polymyalgia rheumatica: Secondary | ICD-10-CM | POA: Diagnosis not present

## 2019-02-24 DIAGNOSIS — Z5181 Encounter for therapeutic drug level monitoring: Secondary | ICD-10-CM

## 2019-02-24 MED ORDER — HYDROCODONE-ACETAMINOPHEN 5-325 MG PO TABS: 1.0000 | ORAL_TABLET | Freq: Three times a day (TID) | ORAL | 0 refills | Status: DC | PRN

## 2019-02-24 NOTE — Progress Notes (Signed)
Subjective:    Patient ID: [name], female    DOB: [birth year], 73 y.o.   MRN: [id number]  HPI: [name] is a 73 y.o. female her appointment was changed, due to national recommendations of social distancing due to COVID 19, an audio/video telehealth visit is felt to be most appropriate for this patient at this time.  See Chart message from today for the patient's consent to telehealth from [facility].    She states her pain is located in her lower back radiating into her right lower extremity. Also reports generalized joint pain all over. She rates her pain 4. Her current exercise regime is walking.  Ms. Wehrs Morphine equivalent is 14.66 MME.  Last UDS was Performed on 12/26/2018, it was consistent.   Angelica Chessman CMA asked the Health and History Questions. This provider and Angelica Chessman verified we were speaking with the correct person using two identifiers.  Pain Inventory Average Pain 8 Pain Right Now 4 My pain is intermittent and aching  In the last 24 hours, has pain interfered with the following? General activity 4 Relation with others 4 Enjoyment of life 4 What TIME of day is your pain at its worst? varies Sleep (in general) Good  Pain is worse with: walking and standing Pain improves with: rest and medication Relief from Meds: 3  Mobility how many minutes can you walk? 10 ability to climb steps?  yes do you drive?  no  Function I need assistance with the following:  meal prep, household duties and shopping  Neuro/Psych weakness numbness tingling trouble walking  Prior Studies Any changes since last visit?  no  Physicians involved in your care Any changes since last visit?  no   Family History  Problem Relation Age of Onset  . Heart disease Father   . Factor V Leiden deficiency Father   . Factor V Leiden deficiency Brother   . Factor V Leiden deficiency Daughter    Social History   Socioeconomic  History  . Marital status: Married    Spouse name: Not on file  . Number of children: Not on file  . Years of education: Not on file  . Highest education level: Not on file  Occupational History  . Not on file  Social Needs  . Financial resource strain: Not on file  . Food insecurity:    Worry: Not on file    Inability: Not on file  . Transportation needs:    Medical: Not on file    Non-medical: Not on file  Tobacco Use  . Smoking status: Current Some Day Smoker  . Smokeless tobacco: Never Used  . Tobacco comment: Smokes e cigarettes  Substance and Sexual Activity  . Alcohol use: No  . Drug use: Not on file  . Sexual activity: Not on file  Lifestyle  . Physical activity:    Days per week: Not on file    Minutes per session: Not on file  . Stress: Not on file  Relationships  . Social connections:    Talks on phone: Not on file    Gets together: Not on file    Attends religious service: Not on file    Active member of club or organization: Not on file    Attends meetings of clubs or organizations: Not on file    Relationship status: Not on file  Other Topics Concern  . Not on file  Social History Narrative  . Not on file  Past Surgical History:  Procedure Laterality Date  . ABDOMINAL HYSTERECTOMY    . BREAST SURGERY    . CATARACT EXTRACTION    . CHOLECYSTECTOMY     Past Medical History:  Diagnosis Date  . Arthritis   . Cataract   . COPD (chronic obstructive pulmonary disease) (HCC)   . Factor 5 Leiden mutation, heterozygous (HCC)   . Hypertension   . Myocardial infarction (HCC)    BP 134/65   Pulse 88   Ht 5\' 4"  (1.626 m)   Wt 126 lb (57.2 kg)   BMI 21.63 kg/m   Opioid Risk Score:   Fall Risk Score:  `1  Depression screen PHQ 2/9  [facility] 09/15/2018 06/20/2018 04/04/2018 11/01/2017 07/25/2017 06/13/2017 03/08/2017  Decreased Interest 1 0 0 0 0 0 0  Down, Depressed, Hopeless 0 0 0 0 0 0 0  PHQ - 2 Score 1 0 0 0 0 0 0  Altered sleeping  0 - - - - - -  Tired, decreased energy 1 - - - - - -  Change in appetite 1 - - - - - -  Feeling bad or failure about yourself  0 - - - - - -  Trouble concentrating 0 - - - - - -  Moving slowly or fidgety/restless 0 - - - - - -  Suicidal thoughts 0 - - - - - -  PHQ-9 Score 3 - - - - - -    Review of Systems  Constitutional: Negative.   HENT: Positive for sinus pressure, sneezing and sore throat.   Eyes: Positive for itching.  Respiratory: Positive for shortness of breath and wheezing.   Cardiovascular: Negative.   Gastrointestinal: Positive for abdominal pain.  Endocrine: Negative.   Genitourinary: Negative.   Musculoskeletal: Positive for arthralgias, back pain and myalgias.  Skin: Negative.   Neurological: Positive for weakness and numbness.       Tingling   Psychiatric/Behavioral: Negative.   All other systems reviewed and are negative.      Objective:   Physical Exam Vitals signs and nursing note reviewed.  Musculoskeletal:     Comments: No Physical Exam: Virtual Visit  Neurological:     Mental Status: She is oriented to person, place, and time.           Assessment & Plan:  1. History of lumbar spinal stenosis/ Right Lumbar Radiculitis.Continue current Medication regime and Encouraged to increase HEP as Tolerated.Continue to Monitor: 02/24/2019 Refilled:Hydrocodone 5/325mg  one tablet every 8 hours, may take an extra tablet when pain is severe.#85  2. Recent left occipital infarct, history of factor V 5 Leiden deficiency: On coumadin. Continue to monitor.02/24/2019 3. Fibromyalgia syndrome:Encouraged to increase HEP as tolerated.02/24/2019 4. Polymyalgia rheumatica: Rheumatology Following.02/24/2019 5. Chronic pain syndrome:Continue current medication Regimen .Continue with exercise, heat, and ice therapy.02/24/2019 6. Nicotine Dependence: Encouraged Smoking Cessation.02/24/2019 7. Hereditary and Idiopathic Peripheral Neuropathy: Continue to Monitor,  Effexor discontinued due to elevated Liver Profile per PCP.Continue HEP as tolerated and current medication regime.02/24/2019 8. Depression: She was prescribed Elavil by [facility]astroenterolog[facility]. No suicidal ideation. Has Family Support. Continue to Monitor.02/24/2019 9. Poor Appetite/ Frail:Appetite improving, she reports.GI Following.02/24/2019. 10.RightShoulder Pain: No complaints today. Continue HEP as Tolerated. Continue to Monitor.02/24/2019 11. Bilateral Greater Trochanteric Bursitis:No complaints today.Continue to Alternate Ice and Heat Therapy.02/24/2019 12. Cervicalgia:No complaints Today.Continue HEP as Tolerated. Continue to Alternate Heat and Ice Therapy. Continue to Monitor.02/24/2019   F/U in 1 month Telephone Call Location of patient: In her  Home Location of provider: Office Established patient Time spent on call: 15 Minutes

## 2019-03-23 ENCOUNTER — Encounter: Payer: Self-pay | Admitting: Registered Nurse

## 2019-03-23 ENCOUNTER — Other Ambulatory Visit: Payer: Self-pay

## 2019-03-23 ENCOUNTER — Encounter: Payer: Medicare Other | Attending: Physical Medicine & Rehabilitation | Admitting: Registered Nurse

## 2019-03-23 VITALS — BP 142/63 | HR 85 | Ht 64.0 in | Wt 126.6 lb

## 2019-03-23 DIAGNOSIS — G894 Chronic pain syndrome: Secondary | ICD-10-CM | POA: Diagnosis not present

## 2019-03-23 DIAGNOSIS — M5416 Radiculopathy, lumbar region: Secondary | ICD-10-CM

## 2019-03-23 DIAGNOSIS — Z79899 Other long term (current) drug therapy: Secondary | ICD-10-CM

## 2019-03-23 DIAGNOSIS — M353 Polymyalgia rheumatica: Secondary | ICD-10-CM | POA: Diagnosis not present

## 2019-03-23 DIAGNOSIS — Z5181 Encounter for therapeutic drug level monitoring: Secondary | ICD-10-CM

## 2019-03-23 DIAGNOSIS — M48061 Spinal stenosis, lumbar region without neurogenic claudication: Secondary | ICD-10-CM

## 2019-03-23 DIAGNOSIS — G609 Hereditary and idiopathic neuropathy, unspecified: Secondary | ICD-10-CM

## 2019-03-23 MED ORDER — HYDROCODONE-ACETAMINOPHEN 5-325 MG PO TABS: 1.0000 | ORAL_TABLET | Freq: Three times a day (TID) | ORAL | 0 refills | Status: DC | PRN

## 2019-03-23 NOTE — Progress Notes (Signed)
Subjective:    Patient ID: [name], female    DOB: Jul 01, 1946, 73 y.o.   MRN: [id number]  HPI: [name] is a 73 y.o. female her appointment was changed, due to national recommendations of social distancing due to COVID 19, an audio/video telehealth visit is felt to be most appropriate for this patient at this time.  See Chart message from today for the patient's consent to telehealth from [facility].     She states her pain is located in her lower back radiating into her right lower extremity.  She rates her pain 5.  Her current exercise regime is walking and performing stretching exercises.  [name] Morphine equivalent is 14.66 MME.  She is also prescribed Alprazolam by Dr. Virginia Rochester. We have discussed the black box warning of using opioids and benzodiazepines. I highlighted the dangers of using these drugs together and discussed the adverse events including respiratory suppression, overdose, cognitive impairment and importance of compliance with current regimen. We will continue to monitor and adjust as indicated.   Last UDS was Performed on 12/26/2018, it was consistent.   Silas Sacramento CMA asked the Health and History Questions. This provider Angelica Chessman  verified we were speaking with the correct person using two identifiers.  Pain Inventory Average Pain 5 Pain Right Now 5 My pain is sharp and aching  In the last 24 hours, has pain interfered with the following? General activity 5 Relation with others 5 Enjoyment of life 5 What TIME of day is your pain at its worst? morning Sleep (in general) Good  Pain is worse with: walking, bending, sitting, inactivity, standing and some activites Pain improves with: medication Relief from Meds: 7  Mobility how many minutes can you walk? 5-10 ability to climb steps?  yes do you drive?  yes  Function retired I need assistance with the following:  meal prep, household duties and shopping   Neuro/Psych weakness numbness confusion  Prior Studies Any changes since last visit?  no  Physicians involved in your care Any changes since last visit?  no   Family History  Problem Relation Age of Onset  . Heart disease Father   . Factor V Leiden deficiency Father   . Factor V Leiden deficiency Brother   . Factor V Leiden deficiency Daughter    Social History   Socioeconomic History  . Marital status: Married    Spouse name: Not on file  . Number of children: Not on file  . Years of education: Not on file  . Highest education level: Not on file  Occupational History  . Not on file  Social Needs  . Financial resource strain: Not on file  . Food insecurity:    Worry: Not on file    Inability: Not on file  . Transportation needs:    Medical: Not on file    Non-medical: Not on file  Tobacco Use  . Smoking status: Current Some Day Smoker  . Smokeless tobacco: Never Used  . Tobacco comment: Smokes e cigarettes  Substance and Sexual Activity  . Alcohol use: No  . Drug use: Not on file  . Sexual activity: Not on file  Lifestyle  . Physical activity:    Days per week: Not on file    Minutes per session: Not on file  . Stress: Not on file  Relationships  . Social connections:    Talks on phone: Not on file    Gets together: Not on  file    Attends religious service: Not on file    Active member of club or organization: Not on file    Attends meetings of clubs or organizations: Not on file    Relationship status: Not on file  Other Topics Concern  . Not on file  Social History Narrative  . Not on file   Past Surgical History:  Procedure Laterality Date  . ABDOMINAL HYSTERECTOMY    . BREAST SURGERY    . CATARACT EXTRACTION    . CHOLECYSTECTOMY     Past Medical History:  Diagnosis Date  . Arthritis   . Cataract   . COPD (chronic obstructive pulmonary disease) (HCC)   . Factor 5 Leiden mutation, heterozygous (HCC)   . Hypertension   . Myocardial  infarction (HCC)    BP (!) 142/63 Comment: pt reported, virtual visit  Pulse 85 Comment: pt reported, virtual visit  Ht 5\' 4"  (1.626 m) Comment: 5\' 4"  (1.626 m)  Wt 126 lb 9.6 oz (57.4 kg) Comment: pt reported, virtual visit  SpO2 (!) 88% Comment: pt reported, virtual visit  BMI 21.73 kg/m   Opioid Risk Score:   Fall Risk Score:  `1  Depression screen PHQ 2/9  Depression screen [facility] 2/9 09/15/2018 06/20/2018 04/04/2018 11/01/2017 07/25/2017 06/13/2017 03/08/2017  Decreased Interest 1 0 0 0 0 0 0  Down, Depressed, Hopeless 0 0 0 0 0 0 0  PHQ - 2 Score 1 0 0 0 0 0 0  Altered sleeping 0 - - - - - -  Tired, decreased energy 1 - - - - - -  Change in appetite 1 - - - - - -  Feeling bad or failure about yourself  0 - - - - - -  Trouble concentrating 0 - - - - - -  Moving slowly or fidgety/restless 0 - - - - - -  Suicidal thoughts 0 - - - - - -  PHQ-9 Score 3 - - - - - -    Review of Systems  Constitutional: Negative.   HENT: Negative.   Eyes: Negative.   Respiratory: Negative.   Cardiovascular: Negative.   Gastrointestinal: Negative.   Endocrine: Negative.   Genitourinary: Negative.   Musculoskeletal: Positive for back pain.       Hip pain  Skin: Negative.   Allergic/Immunologic: Negative.   Neurological: Positive for weakness and numbness.  Psychiatric/Behavioral: Negative.   All other systems reviewed and are negative.      Objective:   Physical Exam Vitals signs and nursing note reviewed.  Musculoskeletal:     Comments: No Physical Perform: Virtual Visit  Neurological:     Mental Status: She is oriented to person, place, and time.           Assessment & Plan:  1. History of lumbar spinal stenosis/ Right Lumbar Radiculitis.Continue current Medication regime and Encouraged to increase HEP as Tolerated.Continue to Monitor: 02/21/2019 Refilled:Hydrocodone 5/325mg  one tablet every 8 hours, may take an extra tablet when pain is severe.#85  2. Recent left  occipital infarct, history of factor V 5 Leiden deficiency: On coumadin. Continue to monitor.03/23/2019 3. Fibromyalgia syndrome:Encouraged to increase HEP as tolerated.03/23/2019 4. Polymyalgia rheumatica: Rheumatology Following.03/23/2019 5. Chronic pain syndrome:Continue current medication Regimen .Continue with exercise, heat, and ice therapy.03/23/2019 6. Nicotine Dependence: Encouraged Smoking Cessation.03/23/2019 7. Hereditary and Idiopathic Peripheral Neuropathy: Continue to Monitor, Effexor discontinued due to elevated Liver Profile per PCP.Continue HEP as tolerated and current medication regime.03/23/2019 8. Depression: She was prescribed  Elavil by [facility]astroenterolog[facility]. No suicidal ideation. Has Family Support. Continue to Monitor.03/23/2019 9. Poor Appetite/ Frail:Appetite improving, she reports.GI Following.03/23/2019. 10.RightShoulder Pain: No complaints today. Continue HEP as Tolerated. Continue to Monitor.02/24/2019 11. Bilateral Greater Trochanteric Bursitis:No complaints today.Continue to Alternate Ice and Heat Therapy.03/23/2019 12. Cervicalgia:No complaints Today.Continue HEP as Tolerated. Continue to Alternate Heat and Ice Therapy. Continue to Monitor.03/23/2019  F/U in 1 month Telephone Call Location of patient: In her Home Location of provider: Office Established patient Time spent on call: 15 Minutes

## 2019-04-24 ENCOUNTER — Encounter: Payer: Medicare Other | Attending: Physical Medicine & Rehabilitation | Admitting: Registered Nurse

## 2019-04-24 ENCOUNTER — Other Ambulatory Visit: Payer: Self-pay

## 2019-04-24 ENCOUNTER — Encounter: Payer: Self-pay | Admitting: Registered Nurse

## 2019-04-24 VITALS — BP 125/61 | HR 80 | Ht 66.0 in | Wt 126.2 lb

## 2019-04-24 DIAGNOSIS — G894 Chronic pain syndrome: Secondary | ICD-10-CM | POA: Insufficient documentation

## 2019-04-24 DIAGNOSIS — M5416 Radiculopathy, lumbar region: Secondary | ICD-10-CM

## 2019-04-24 DIAGNOSIS — M353 Polymyalgia rheumatica: Secondary | ICD-10-CM | POA: Insufficient documentation

## 2019-04-24 DIAGNOSIS — Z5181 Encounter for therapeutic drug level monitoring: Secondary | ICD-10-CM | POA: Insufficient documentation

## 2019-04-24 DIAGNOSIS — M48061 Spinal stenosis, lumbar region without neurogenic claudication: Secondary | ICD-10-CM

## 2019-04-24 DIAGNOSIS — Z79899 Other long term (current) drug therapy: Secondary | ICD-10-CM | POA: Insufficient documentation

## 2019-04-24 DIAGNOSIS — G609 Hereditary and idiopathic neuropathy, unspecified: Secondary | ICD-10-CM

## 2019-04-24 MED ORDER — HYDROCODONE-ACETAMINOPHEN 5-325 MG PO TABS: 1.0000 | ORAL_TABLET | Freq: Three times a day (TID) | ORAL | 0 refills | Status: DC | PRN

## 2019-04-24 MED ORDER — HYDROCODONE-ACETAMINOPHEN 5-325 MG PO TABS: 1.0000 | ORAL_TABLET | Freq: Three times a day (TID) | ORAL | 0 refills | Status: AC | PRN

## 2019-04-24 NOTE — Progress Notes (Signed)
Subjective:    Patient ID: [name], female    DOB: [birth year], 73 y.o.   MRN: [id number]  HPI: [name] is a 73 y.o. female her appointment was changed, due to national recommendations of social distancing due to COVID 19, an audio/video telehealth visit is felt to be most appropriate for this patient at this time.  See Chart message from today for the patient's consent to telehealth [facility].     She states her pain is located in her lower back radiating into her right lower extremity. She rates her pain 5. Her current exercise regime is walking.   [name] Morphine equivalent is 14.66 MME. She is also prescribed Alprazolam by Dr. Virginia Rochesterrr. We have discussed the black box warning of using opioids and benzodiazepines. I highlighted the dangers of using these drugs together and discussed the adverse events including respiratory suppression, overdose, cognitive impairment and importance of compliance with current regimen. We will continue to monitor and adjust as indicated.   Silas SacramentoLakeesha Donaldson CMA asked the Health and History Questions, this provider and [name] verified we were speaking with the correct person using two identifiers.  Pain Inventory Average Pain 5 Pain Right Now 5 My pain is sharp and aching  In the last 24 hours, has pain interfered with the following? General activity 5 Relation with others 5 Enjoyment of life 5 What TIME of day is your pain at its worst? morning Sleep (in general) Good  Pain is worse with: walking, bending, sitting, inactivity, standing and some activites Pain improves with: medication Relief from Meds: 7  Mobility how many minutes can you walk? 5-10 ability to climb steps?  yes do you drive?  yes  Function retired I need assistance with the following:  meal prep, household duties and shopping  Neuro/Psych weakness numbness confusion  Prior Studies Any changes since last visit?  no   Physicians involved in your care Any changes since last visit?  no   Family History  Problem Relation Age of Onset  . Heart disease Father   . Factor V Leiden deficiency Father   . Factor V Leiden deficiency Brother   . Factor V Leiden deficiency Daughter    Social History   Socioeconomic History  . Marital status: Married    Spouse name: Not on file  . Number of children: Not on file  . Years of education: Not on file  . Highest education level: Not on file  Occupational History  . Not on file  Social Needs  . Financial resource strain: Not on file  . Food insecurity:    Worry: Not on file    Inability: Not on file  . Transportation needs:    Medical: Not on file    Non-medical: Not on file  Tobacco Use  . Smoking status: Current Some Day Smoker  . Smokeless tobacco: Never Used  . Tobacco comment: Smokes e cigarettes  Substance and Sexual Activity  . Alcohol use: No  . Drug use: Not on file  . Sexual activity: Not on file  Lifestyle  . Physical activity:    Days per week: Not on file    Minutes per session: Not on file  . Stress: Not on file  Relationships  . Social connections:    Talks on phone: Not on file    Gets together: Not on file    Attends religious service: Not on file    Active member of club  or organization: Not on file    Attends meetings of clubs or organizations: Not on file    Relationship status: Not on file  Other Topics Concern  . Not on file  Social History Narrative  . Not on file   Past Surgical History:  Procedure Laterality Date  . ABDOMINAL HYSTERECTOMY    . BREAST SURGERY    . CATARACT EXTRACTION    . CHOLECYSTECTOMY     Past Medical History:  Diagnosis Date  . Arthritis   . Cataract   . COPD (chronic obstructive pulmonary disease) (HCC)   . Factor 5 Leiden mutation, heterozygous (HCC)   . Hypertension   . Myocardial infarction (HCC)    There were no vitals taken for this visit.  Opioid Risk Score:   Fall Risk  Score:  `1  Depression screen PHQ 2/9  Depression screen [facility] 2/9 09/15/2018 06/20/2018 04/04/2018 11/01/2017 07/25/2017 06/13/2017 03/08/2017  Decreased Interest 1 0 0 0 0 0 0  Down, Depressed, Hopeless 0 0 0 0 0 0 0  PHQ - 2 Score 1 0 0 0 0 0 0  Altered sleeping 0 - - - - - -  Tired, decreased energy 1 - - - - - -  Change in appetite 1 - - - - - -  Feeling bad or failure about yourself  0 - - - - - -  Trouble concentrating 0 - - - - - -  Moving slowly or fidgety/restless 0 - - - - - -  Suicidal thoughts 0 - - - - - -  PHQ-9 Score 3 - - - - - -     Review of Systems  Constitutional: Negative.   HENT: Negative.   Eyes: Negative.   Respiratory: Negative.   Cardiovascular: Negative.   Gastrointestinal: Negative.   Endocrine: Negative.   Genitourinary: Negative.   Musculoskeletal: Positive for back pain.       Leg pain  Skin: Negative.   Allergic/Immunologic: Negative.   Neurological: Negative.   Hematological: Negative.   Psychiatric/Behavioral: Negative.   All other systems reviewed and are negative.      Objective:   Physical Exam Vitals signs and nursing note reviewed.  Musculoskeletal:     Comments: No Physical Exam Performed: Virtual Visit  Neurological:     Mental Status: She is oriented to person, place, and time.           Assessment & Plan:  1. History of lumbar spinal stenosis/ Right Lumbar Radiculitis.Continue current Medication regime and Encouraged to increase HEP as Tolerated.Continue to Monitor: 04/24/2019 Refilled:Hydrocodone 5/325mg  one tablet every 8 hours, may take an extra tablet when pain is severe.#85  2. Recent left occipital infarct, history of factor V 5 Leiden deficiency: On coumadin. Continue to monitor.04/24/2019 3. Fibromyalgia syndrome:Encouraged to increase HEP as tolerated.04/24/2019 4. Polymyalgia rheumatica: Rheumatology Following.04/24/2019 5. Chronic pain syndrome:Continue current medication Regimen .Continue with  exercise, heat, and ice therapy.04/24/2019 6. Nicotine Dependence: Encouraged Smoking Cessation.04/24/2019 7. Hereditary and Idiopathic Peripheral Neuropathy: Continue to Monitor, Effexor discontinued due to elevated Liver Profile per PCP.Continue HEP as tolerated and current medication regime.04/24/2019 8. Depression: She was prescribed Elavil by Gastroenterologis. No suicidal ideation. Has Family Support. Continue to Monitor.04/24/2019 9. Poor Appetite/ Frail:Appetite improving, she reports.GI Following.04/24/2019. 10.RightShoulder Pain:No complaints today.Continue HEP as Tolerated. Continue to Monitor.04/24/2019 11. Bilateral Greater Trochanteric Bursitis:No complaints today.Continue to Alternate Ice and Heat Therapy.04/24/2019 12. Cervicalgia:No complaints Today.Continue HEP as Tolerated. Continue to Alternate Heat and Ice Therapy. Continue to Monitor.04/24/2019  Telephone Call Location of patient: In her Home Location of provider: Office Established patient Time spent on call: 10 minute

## 2019-06-05 ENCOUNTER — Encounter: Payer: Medicare Other | Attending: Physical Medicine & Rehabilitation | Admitting: Registered Nurse

## 2019-06-05 ENCOUNTER — Encounter: Payer: Self-pay | Admitting: Registered Nurse

## 2019-06-05 ENCOUNTER — Other Ambulatory Visit: Payer: Self-pay

## 2019-06-05 VITALS — BP 150/74 | HR 77 | Ht 66.0 in | Wt 126.0 lb

## 2019-06-05 DIAGNOSIS — M7061 Trochanteric bursitis, right hip: Secondary | ICD-10-CM

## 2019-06-05 DIAGNOSIS — Z79899 Other long term (current) drug therapy: Secondary | ICD-10-CM

## 2019-06-05 DIAGNOSIS — M353 Polymyalgia rheumatica: Secondary | ICD-10-CM | POA: Diagnosis not present

## 2019-06-05 DIAGNOSIS — Z5181 Encounter for therapeutic drug level monitoring: Secondary | ICD-10-CM

## 2019-06-05 DIAGNOSIS — M48061 Spinal stenosis, lumbar region without neurogenic claudication: Secondary | ICD-10-CM

## 2019-06-05 DIAGNOSIS — M5416 Radiculopathy, lumbar region: Secondary | ICD-10-CM

## 2019-06-05 DIAGNOSIS — G894 Chronic pain syndrome: Secondary | ICD-10-CM

## 2019-06-05 NOTE — Progress Notes (Signed)
Subjective:    Patient ID: [name], female    DOB: January 09, 1946, 73 y.o.   MRN: [id number]  HPI: [name] is a 73 y.o. female her appointment was changed to tele-health visit. [name] requested a Web-ex visit, this was discussed with Dr. Letta Pate he agreed to have [name] received a Web-ex visit.  [name] consented to tele-health visit.   She states her pain is located in her neck,lower back, right hip and right lower extremity.  She rates her pain 3. Her current exercise regime is walking.  [name] Morphine equivalent is 14.66 MME. She  is also prescribed Alprazolam  by [name]. We have discussed the black box warning of using opioids and benzodiazepines. I highlighted the dangers of using these drugs together and discussed the adverse events including respiratory suppression, overdose, cognitive impairment and importance of compliance with current regimen. We will continue to monitor and adjust as indicated.    Last UDs was Performed on 12/26/2018, it was consistent.   [name]  CMA asked the Health and History Questions, this provider and [name] verified we were speaking with the correct person using two identifiers.   Pain Inventory Average Pain 5 Pain Right Now 3 My pain is intermittent, burning and dull  In the last 24 hours, has pain interfered with the following? General activity 2 Relation with others 5 Enjoyment of life 2 What TIME of day is your pain at its worst? evening Sleep (in general) Fair  Pain is worse with: walking, bending, sitting and inactivity Pain improves with: medication Relief from Meds: 9  Mobility walk without assistance ability to climb steps?  yes do you drive?  yes  Function retired  Neuro/Psych bowel control problems spasms anxiety  Prior Studies no  Physicians involved in your care no   Family History  Problem Relation Age of Onset  . Heart disease Father   . Factor V Leiden  deficiency Father   . Factor V Leiden deficiency Brother   . Factor V Leiden deficiency Daughter    Social History   Socioeconomic History  . Marital status: Married    Spouse name: Not on file  . Number of children: Not on file  . Years of education: Not on file  . Highest education level: Not on file  Occupational History  . Not on file  Social Needs  . Financial resource strain: Not on file  . Food insecurity    Worry: Not on file    Inability: Not on file  . Transportation needs    Medical: Not on file    Non-medical: Not on file  Tobacco Use  . Smoking status: Current Some Day Smoker  . Smokeless tobacco: Never Used  . Tobacco comment: Smokes e cigarettes  Substance and Sexual Activity  . Alcohol use: No  . Drug use: Not on file  . Sexual activity: Not on file  Lifestyle  . Physical activity    Days per week: Not on file    Minutes per session: Not on file  . Stress: Not on file  Relationships  . Social Herbalist on phone: Not on file    Gets together: Not on file    Attends religious service: Not on file    Active member of club or organization: Not on file    Attends meetings of clubs or organizations: Not on file    Relationship status: Not on file  Other Topics Concern  .  Not on file  Social History Narrative  . Not on file   Past Surgical History:  Procedure Laterality Date  . ABDOMINAL HYSTERECTOMY    . BREAST SURGERY    . CATARACT EXTRACTION    . CHOLECYSTECTOMY     Past Medical History:  Diagnosis Date  . Arthritis   . Cataract   . COPD (chronic obstructive pulmonary disease) (HCC)   . Factor 5 Leiden mutation, heterozygous (HCC)   . Hypertension   . Myocardial infarction (HCC)    There were no vitals taken for this visit.  Opioid Risk Score:   Fall Risk Score:  `1  Depression screen PHQ 2/9  [facility] 09/15/2018 06/20/2018 04/04/2018 11/01/2017 07/25/2017 06/13/2017 03/08/2017  Decreased Interest 1 0 0 0 0 0 0   Down, Depressed, Hopeless 0 0 0 0 0 0 0  PHQ - 2 Score 1 0 0 0 0 0 0  Altered sleeping 0 - - - - - -  Tired, decreased energy 1 - - - - - -  Change in appetite 1 - - - - - -  Feeling bad or failure about yourself  0 - - - - - -  Trouble concentrating 0 - - - - - -  Moving slowly or fidgety/restless 0 - - - - - -  Suicidal thoughts 0 - - - - - -  PHQ-9 Score 3 - - - - - -      Review of Systems  All other systems reviewed and are negative.      Objective:   Physical Exam Vitals signs and nursing note reviewed.  Musculoskeletal:     Comments: No Physical Exam Performed: Virtual Visit  Neurological:     Mental Status: She is oriented to person, place, and time.           Assessment & Plan:  1. History of lumbar spinal stenosis/ Right Lumbar Radiculitis.Continue current Medication regime and Encouraged to increase HEP as Tolerated.Continue to Monitor: 06/05/2019 Continue: Hydrocodone 5/325mg  one tablet every 8 hours, may take an extra tablet when pain is severe.#85  2. Recent left occipital infarct, history of factor V 5 Leiden deficiency: On coumadin. Continue to monitor.06/05/2019 3. Fibromyalgia syndrome:Encouraged to increase HEP as tolerated.06/05/2019 4. Polymyalgia rheumatica: Rheumatology Following.06/05/2019 5. Chronic pain syndrome:Continue current medication Regimen .Continue with exercise, heat, and ice therapy.06/05/2019 6. Nicotine Dependence: Encouraged Smoking Cessation.06/05/2019 7. Hereditary and Idiopathic Peripheral Neuropathy: Continue to Monitor, Effexor discontinued due to elevated Liver Profile per PCP.Continue HEP as tolerated and current medication regime.06/05/2019 8. Depression: She was prescribed Elavil by Gastroenterologis. No suicidal ideation. Has Family Support. Continue to Monitor.06/05/2019 9. Poor Appetite/ Frail:Appetite improving, she reports.GI Following.06/05/2019. 10.RightShoulder Pain:No complaints today.Continue HEP  as Tolerated. Continue to Monitor.04/24/2019 11. Right Greater Trochanteric Bursitis:No complaints today.Continue to Alternate Ice and Heat Therapy.06/05/2019 12. Cervicalgia:.Continue HEP as Tolerated. Continue to Alternate Heat and Ice Therapy. Continue to Monitor.06/05/2019  Web-ex Location of patient: In her Home Location of provider: Office Established patient Time spent on call: 10 minute

## 2019-07-24 ENCOUNTER — Encounter: Payer: Medicare Other | Admitting: Registered Nurse

## 2019-08-13 ENCOUNTER — Ambulatory Visit: Payer: Medicare Other | Admitting: Registered Nurse

## 2019-08-20 DEATH — deceased

## 2021-07-27 NOTE — Telephone Encounter (Signed)
error
